# Patient Record
Sex: Female | Born: 1940
Health system: Southern US, Community
[De-identification: ages and names within clinical notes are randomized; demographics above are authoritative.]

## PROBLEM LIST (undated history)

## (undated) ENCOUNTER — Emergency Department (HOSPITAL_BASED_OUTPATIENT_CLINIC_OR_DEPARTMENT_OTHER): Admission: EM | Payer: Medicare HMO | Source: Home / Self Care

## (undated) DIAGNOSIS — F329 Major depressive disorder, single episode, unspecified: Secondary | ICD-10-CM

## (undated) DIAGNOSIS — I4891 Unspecified atrial fibrillation: Secondary | ICD-10-CM

## (undated) DIAGNOSIS — G2581 Restless legs syndrome: Secondary | ICD-10-CM

## (undated) DIAGNOSIS — F32A Depression, unspecified: Secondary | ICD-10-CM

## (undated) DIAGNOSIS — I10 Essential (primary) hypertension: Secondary | ICD-10-CM

## (undated) DIAGNOSIS — I639 Cerebral infarction, unspecified: Secondary | ICD-10-CM

## (undated) DIAGNOSIS — E785 Hyperlipidemia, unspecified: Secondary | ICD-10-CM

## (undated) HISTORY — PX: SKIN BIOPSY: SHX1

## (undated) HISTORY — PX: BACK SURGERY: SHX140

## (undated) HISTORY — PX: COLON SURGERY: SHX602

## (undated) HISTORY — PX: EYE SURGERY: SHX253

## (undated) HISTORY — PX: CHOLECYSTECTOMY: SHX55

## (undated) HISTORY — PX: BREAST SURGERY: SHX581

## (undated) HISTORY — PX: ABDOMINAL HYSTERECTOMY: SHX81

## (undated) HISTORY — PX: TONSILLECTOMY: SUR1361

## (undated) HISTORY — PX: CARPAL TUNNEL WITH CUBITAL TUNNEL: SHX5608

## (undated) HISTORY — DX: Restless legs syndrome: G25.81

## (undated) HISTORY — DX: Cerebral infarction, unspecified: I63.9

## (undated) HISTORY — PX: JOINT REPLACEMENT: SHX530

## (undated) HISTORY — DX: Unspecified atrial fibrillation: I48.91

---
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
## 2019-02-21 ENCOUNTER — Encounter (HOSPITAL_BASED_OUTPATIENT_CLINIC_OR_DEPARTMENT_OTHER): Payer: Self-pay | Admitting: *Deleted

## 2019-02-21 ENCOUNTER — Other Ambulatory Visit: Payer: Self-pay

## 2019-02-21 ENCOUNTER — Emergency Department (HOSPITAL_BASED_OUTPATIENT_CLINIC_OR_DEPARTMENT_OTHER)
Admission: EM | Admit: 2019-02-21 | Discharge: 2019-02-22 | Disposition: A | Discharge status: Home / Self Care | Payer: Medicare HMO | Attending: Emergency Medicine | Admitting: Emergency Medicine

## 2019-02-21 DIAGNOSIS — Z79899 Other long term (current) drug therapy: Secondary | ICD-10-CM | POA: Insufficient documentation

## 2019-02-21 DIAGNOSIS — I10 Essential (primary) hypertension: Secondary | ICD-10-CM | POA: Insufficient documentation

## 2019-02-21 DIAGNOSIS — R040 Epistaxis: Secondary | ICD-10-CM

## 2019-02-21 HISTORY — DX: Hyperlipidemia, unspecified: E78.5

## 2019-02-21 HISTORY — DX: Depression, unspecified: F32.A

## 2019-02-21 HISTORY — DX: Essential (primary) hypertension: I10

## 2019-02-21 MED ORDER — SILVER NITRATE-POT NITRATE 75-25 % EX MISC
CUTANEOUS | Status: AC
  Filled 2019-02-21: qty 10

## 2019-02-21 NOTE — ED Notes (Signed)
Nose bleeding  Stopped .

## 2019-02-21 NOTE — ED Provider Notes (Signed)
[facility] Provider Note: Stephanie Spurling, MD, FACEP  CSN: [id number] MRN: [id number] ARRIVAL: 02/21/19 at 2041 ROOM: MH10/[facility]10   CHIEF COMPLAINT  Epistaxis   HISTORY OF PRESENT ILLNESS  02/21/19 10:57 PM Stephanie Rodgers is a 78 y.o. female who has had sneezing and nasal congestion for most of the day.  About about 6:30 PM she developed a nosebleed that began on the right-hand side.  The flow was heavy and she was unable to get it stopped.  EMS applied pressure ice and Afrin prior to arrival and then her nose was clamped for about an hour prior to my evaluation.  On my evaluation the patient's nosebleeds has stopped.  She denies any associated pain.  At its worst she had blood coming from both nares as well as going down her throat.   Past Medical History:  Diagnosis Date  . Depression   . Hyperlipidemia   . Hypertension     History reviewed. No pertinent surgical history.  No family history on file.  Social History   Tobacco Use  . Smoking status: Not on file  Substance Use Topics  . Alcohol use: Not on file  . Drug use: Not on file    Prior to Admission medications   Medication Sig Start Date End Date Taking? Authorizing Provider  clonazePAM (KLONOPIN) 0.5 MG tablet TAKE ONE TABLET BY MOUTH EVERY NIGHT AT BEDTIME AS NEEDED FOR SLEEP 02/20/19  Yes [provider]  LORazepam (ATIVAN) 0.5 MG tablet Take 0.5 mg by mouth every 8 (eight) hours.   Yes [provider]  metoprolol tartrate (LOPRESSOR) 25 MG tablet Take 25 mg by mouth 2 (two) times daily.   Yes [provider]  rosuvastatin (CRESTOR) 20 MG tablet Take 20 mg by mouth daily.   Yes [provider]  sertraline (ZOLOFT) 50 MG tablet Take 50 mg by mouth daily.   Yes [provider]  amitriptyline (ELAVIL) 25 MG tablet Take by mouth.    [provider]    Allergies Alendronate, Ciprofloxacin, Hydrochlorothiazide, Penicillins, and Colesevelam   REVIEW  OF SYSTEMS  Negative except as noted here or in the History of Present Illness.   PHYSICAL EXAMINATION  Initial Vital Signs Blood pressure (!) 180/76, pulse (!) 53, temperature 98.9 F (37.2 C), temperature source Oral, resp. rate 18, height 5\' 4"  (1.626 m), weight 86.2 kg, SpO2 98 %.  Examination General: Well-developed, well-nourished female in no acute distress; appearance consistent with age of record HENT: normocephalic; atraumatic; clotted blood right medial nasal septum Eyes: pupils equal, round and reactive to light; extraocular muscles intact; bilateral pseudophakia Neck: supple Heart: regular rate and rhythm Lungs: clear to auscultation bilaterally Abdomen: soft; nondistended; nontender; bowel sounds present Extremities: No deformity; pulses normal Neurologic: Awake, alert and oriented; motor function intact in all extremities and symmetric; no facial droop Skin: Warm and dry Psychiatric: Normal mood and affect   RESULTS  Summary of this visit's results, reviewed and interpreted by myself:   EKG Interpretation  Date/Time:    Ventricular Rate:    PR Interval:    QRS Duration:   QT Interval:    QTC Calculation:   R Axis:     Text Interpretation:        Laboratory Studies: No results found for this or any previous visit (from the past 24 hour(s)). Imaging Studies: No results found.  ED COURSE and MDM  Nursing notes, initial and subsequent vitals signs, including pulse oximetry, reviewed and  interpreted by myself.  Vitals:   02/21/19 2043 02/21/19 2049 02/21/19 2053 02/21/19 2228  BP:   (!) 208/92 (!) 180/76  Pulse:   65 (!) 53  Resp:   18   Temp:   98.9 F (37.2 C)   TempSrc:   Oral   SpO2: 94%  99% 98%  Weight:  86.2 kg    Height:  5\' 4"  (1.626 m)     Medications  silver nitrate applicators 75-25 % applicator (has no administration in time range)    12:21 AM Bleeding site remains hemostatic.  PROCEDURES  Procedures  Bleeding site on right  nasal septum cauterized with silver nitrate.  Patient tolerated this well and there were no immediate complications.  ED DIAGNOSES     ICD-10-CM   1. Acute anterior epistaxis  R04.0        Adisa Vigeant, , MD 02/22/19 [phone]

## 2019-02-21 NOTE — ED Notes (Signed)
Afrin given with nose clamp placed .

## 2019-02-21 NOTE — ED Triage Notes (Signed)
Pressure , ice and Afrin 2 spray PTA by EMS for nosebleed x 45 mins

## 2019-02-22 MED ORDER — OXYMETAZOLINE HCL 0.05 % NA SOLN
NASAL | Status: AC
  Filled 2019-02-22: qty 30

## 2019-02-22 NOTE — ED Notes (Signed)
Family at bedside, no bleeding noted from nose

## 2019-06-24 LAB — COLOGUARD: COLOGUARD: NEGATIVE

## 2019-06-24 LAB — EXTERNAL GENERIC LAB PROCEDURE: COLOGUARD: NEGATIVE

## 2019-12-08 ENCOUNTER — Other Ambulatory Visit: Payer: Self-pay

## 2019-12-08 ENCOUNTER — Emergency Department (HOSPITAL_COMMUNITY): Payer: Medicare HMO

## 2019-12-08 ENCOUNTER — Inpatient Hospital Stay (HOSPITAL_COMMUNITY)
Admission: EM | Admit: 2019-12-08 | Discharge: 2019-12-11 | DRG: 065 | Disposition: A | Discharge status: Home Health | Payer: Medicare HMO | Attending: Neurology | Admitting: Neurology

## 2019-12-08 ENCOUNTER — Inpatient Hospital Stay (HOSPITAL_COMMUNITY): Payer: Medicare HMO

## 2019-12-08 ENCOUNTER — Encounter (HOSPITAL_COMMUNITY): Payer: Self-pay | Admitting: Radiology

## 2019-12-08 DIAGNOSIS — Z6830 Body mass index (BMI) 30.0-30.9, adult: Secondary | ICD-10-CM

## 2019-12-08 DIAGNOSIS — Z20822 Contact with and (suspected) exposure to covid-19: Secondary | ICD-10-CM | POA: Diagnosis present

## 2019-12-08 DIAGNOSIS — E785 Hyperlipidemia, unspecified: Secondary | ICD-10-CM | POA: Diagnosis present

## 2019-12-08 DIAGNOSIS — I63412 Cerebral infarction due to embolism of left middle cerebral artery: Secondary | ICD-10-CM | POA: Diagnosis present

## 2019-12-08 DIAGNOSIS — E669 Obesity, unspecified: Secondary | ICD-10-CM | POA: Diagnosis present

## 2019-12-08 DIAGNOSIS — I63512 Cerebral infarction due to unspecified occlusion or stenosis of left middle cerebral artery: Secondary | ICD-10-CM

## 2019-12-08 DIAGNOSIS — Z881 Allergy status to other antibiotic agents status: Secondary | ICD-10-CM | POA: Diagnosis not present

## 2019-12-08 DIAGNOSIS — I639 Cerebral infarction, unspecified: Secondary | ICD-10-CM

## 2019-12-08 DIAGNOSIS — I350 Nonrheumatic aortic (valve) stenosis: Secondary | ICD-10-CM | POA: Diagnosis not present

## 2019-12-08 DIAGNOSIS — F419 Anxiety disorder, unspecified: Secondary | ICD-10-CM | POA: Diagnosis present

## 2019-12-08 DIAGNOSIS — Z888 Allergy status to other drugs, medicaments and biological substances status: Secondary | ICD-10-CM | POA: Diagnosis not present

## 2019-12-08 DIAGNOSIS — Z880 Allergy status to penicillin: Secondary | ICD-10-CM | POA: Diagnosis not present

## 2019-12-08 DIAGNOSIS — Z79899 Other long term (current) drug therapy: Secondary | ICD-10-CM

## 2019-12-08 DIAGNOSIS — I358 Other nonrheumatic aortic valve disorders: Secondary | ICD-10-CM | POA: Diagnosis present

## 2019-12-08 DIAGNOSIS — I6340 Cerebral infarction due to embolism of unspecified cerebral artery: Secondary | ICD-10-CM | POA: Diagnosis present

## 2019-12-08 DIAGNOSIS — Z713 Dietary counseling and surveillance: Secondary | ICD-10-CM

## 2019-12-08 DIAGNOSIS — R29703 NIHSS score 3: Secondary | ICD-10-CM | POA: Diagnosis present

## 2019-12-08 DIAGNOSIS — R4701 Aphasia: Secondary | ICD-10-CM | POA: Diagnosis present

## 2019-12-08 DIAGNOSIS — R414 Neurologic neglect syndrome: Secondary | ICD-10-CM | POA: Diagnosis present

## 2019-12-08 DIAGNOSIS — F329 Major depressive disorder, single episode, unspecified: Secondary | ICD-10-CM | POA: Diagnosis present

## 2019-12-08 DIAGNOSIS — I10 Essential (primary) hypertension: Secondary | ICD-10-CM | POA: Diagnosis present

## 2019-12-08 DIAGNOSIS — I4891 Unspecified atrial fibrillation: Secondary | ICD-10-CM

## 2019-12-08 DIAGNOSIS — I340 Nonrheumatic mitral (valve) insufficiency: Secondary | ICD-10-CM | POA: Diagnosis not present

## 2019-12-08 LAB — I-STAT CHEM 8, ED
BUN: 27 mg/dL — ABNORMAL HIGH (ref 8–23)
Calcium, Ion: 0.96 mmol/L — ABNORMAL LOW (ref 1.15–1.40)
Chloride: 112 mmol/L — ABNORMAL HIGH (ref 98–111)
Creatinine, Ser: 0.6 mg/dL (ref 0.44–1.00)
Glucose, Bld: 111 mg/dL — ABNORMAL HIGH (ref 70–99)
HCT: 41 % (ref 36.0–46.0)
Hemoglobin: 13.9 g/dL (ref 12.0–15.0)
Potassium: 4.6 mmol/L (ref 3.5–5.1)
Sodium: 140 mmol/L (ref 135–145)
TCO2: 22 mmol/L (ref 22–32)

## 2019-12-08 LAB — MRSA PCR SCREENING: MRSA by PCR: NEGATIVE

## 2019-12-08 LAB — COMPREHENSIVE METABOLIC PANEL
ALT: 17 U/L (ref 0–44)
AST: 24 U/L (ref 15–41)
Albumin: 3.8 g/dL (ref 3.5–5.0)
Alkaline Phosphatase: 77 U/L (ref 38–126)
Anion gap: 14 (ref 5–15)
BUN: 19 mg/dL (ref 8–23)
CO2: 16 mmol/L — ABNORMAL LOW (ref 22–32)
Calcium: 8.9 mg/dL (ref 8.9–10.3)
Chloride: 109 mmol/L (ref 98–111)
Creatinine, Ser: 0.72 mg/dL (ref 0.44–1.00)
GFR calc Af Amer: >60 mL/min (ref >60)
GFR calc non Af Amer: >60 mL/min (ref >60)
Glucose, Bld: 114 mg/dL — ABNORMAL HIGH (ref 70–99)
Potassium: 4.4 mmol/L (ref 3.5–5.1)
Sodium: 139 mmol/L (ref 135–145)
Total Bilirubin: 0.5 mg/dL (ref 0.3–1.2)
Total Protein: 7 g/dL (ref 6.5–8.1)

## 2019-12-08 LAB — ECHOCARDIOGRAM COMPLETE
AR max vel: 1.49 cm2
AV Area VTI: 1.64 cm2
AV Area mean vel: 1.35 cm2
AV Mean grad: 10.1 mmHg
AV Peak grad: 18 mmHg
Ao pk vel: 2.12 m/s
Area-P 1/2: 3.03 cm2
Height: 64 in
MV M vel: 4.93 m/s
MV Peak grad: 97.2 mmHg
S' Lateral: 2 cm
Weight: 2880 oz

## 2019-12-08 LAB — APTT: aPTT: 29 seconds (ref 24–36)

## 2019-12-08 LAB — PROTIME-INR
INR: 1.1 (ref 0.8–1.2)
Prothrombin Time: 13.3 seconds (ref 11.4–15.2)

## 2019-12-08 LAB — CBC
HCT: 40.8 % (ref 36.0–46.0)
Hemoglobin: 12.8 g/dL (ref 12.0–15.0)
MCH: 29.7 pg (ref 26.0–34.0)
MCHC: 31.4 g/dL (ref 30.0–36.0)
MCV: 94.7 fL (ref 80.0–100.0)
Platelets: 223 10*3/uL (ref 150–400)
RBC: 4.31 MIL/uL (ref 3.87–5.11)
RDW: 15.6 % — ABNORMAL HIGH (ref 11.5–15.5)
WBC: 8.8 10*3/uL (ref 4.0–10.5)
nRBC: 0 % (ref 0.0–0.2)

## 2019-12-08 LAB — DIFFERENTIAL
Abs Immature Granulocytes: 0.03 10*3/uL (ref 0.00–0.07)
Basophils Absolute: 0.1 10*3/uL (ref 0.0–0.1)
Basophils Relative: 1 %
Eosinophils Absolute: 0.1 10*3/uL (ref 0.0–0.5)
Eosinophils Relative: 1 %
Immature Granulocytes: 0 %
Lymphocytes Relative: 18 %
Lymphs Abs: 1.6 10*3/uL (ref 0.7–4.0)
Monocytes Absolute: 0.5 10*3/uL (ref 0.1–1.0)
Monocytes Relative: 6 %
Neutro Abs: 6.4 10*3/uL (ref 1.7–7.7)
Neutrophils Relative %: 74 %

## 2019-12-08 LAB — LACTIC ACID, PLASMA: Lactic Acid, Venous: 1.2 mmol/L (ref 0.5–1.9)

## 2019-12-08 LAB — SARS CORONAVIRUS 2 BY RT PCR (HOSPITAL ORDER, PERFORMED IN ~~LOC~~ HOSPITAL LAB): SARS Coronavirus 2: NEGATIVE

## 2019-12-08 LAB — ETHANOL: Alcohol, Ethyl (B): <10 mg/dL (ref <10)

## 2019-12-08 IMAGING — MR MR MRA NECK W/O CM
1 series · 48 of 48 positions shown · non-contrast
Comparison: None.

CLINICAL DATA: Aphasia

EXAM:
MRA NECK WITHOUT CONTRAST
TECHNIQUE: Angiographic images of the neck were obtained using MRA technique
without intravenous contrast. Carotid stenosis measurements (when
applicable) are obtained utilizing NASCET criteria, using the distal
internal carotid diameter as the denominator.

[Series 7: [id number]_tra (better) · axial · 0.8mm · 0.78mm/px · z∈[-280,-86]mm · 48 of 246 slices shown]
[im 1/246]
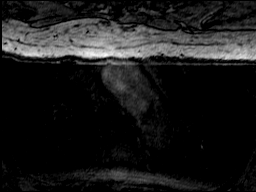
[im 6/246]
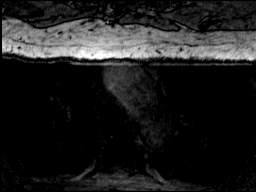
[im 11/246]
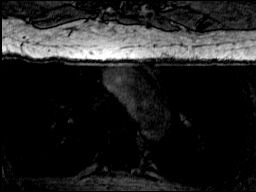
[im 16/246]
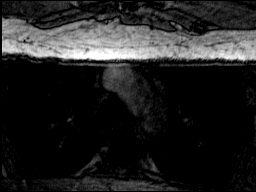
[im 21/246]
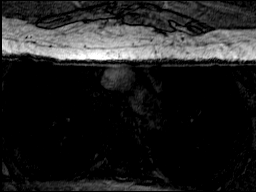
[im 27/246]
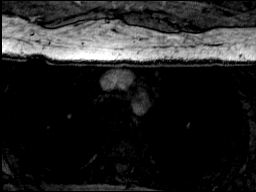
[im 32/246]
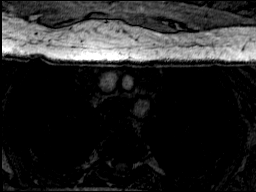
[im 37/246]
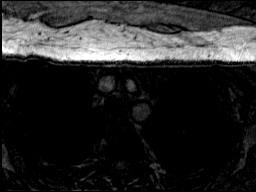
[im 42/246]
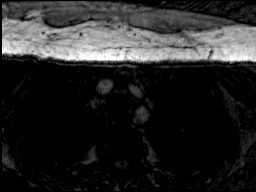
[im 47/246]
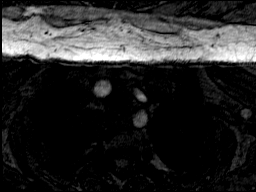
[im 53/246]
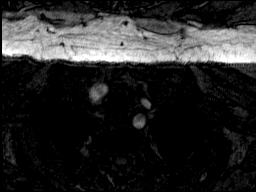
[im 58/246]
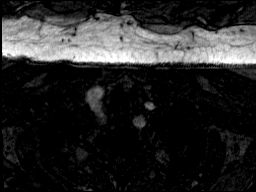
[im 63/246]
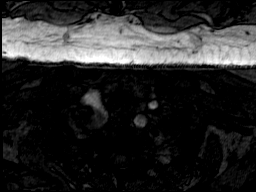
[im 68/246]
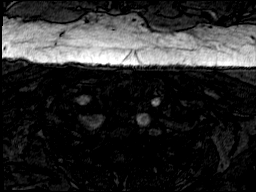
[im 73/246]
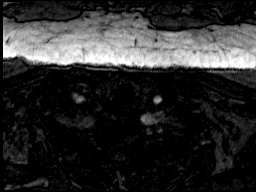
[im 79/246]
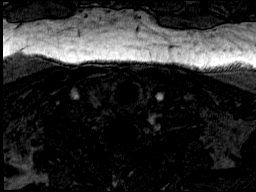
[im 84/246]
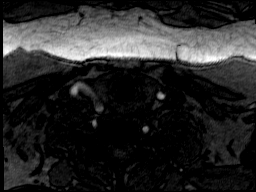
[im 89/246]
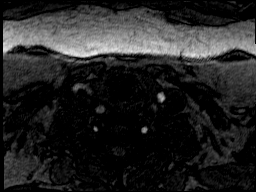
[im 94/246]
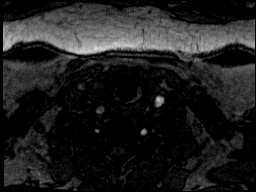
[im 100/246]
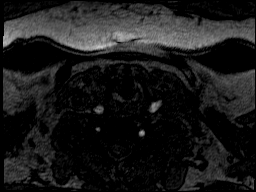
[im 105/246]
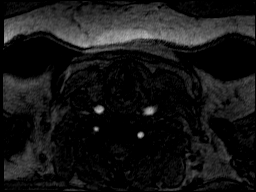
[im 110/246]
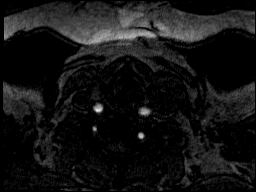
[im 115/246]
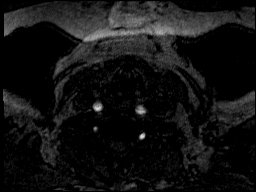
[im 120/246]
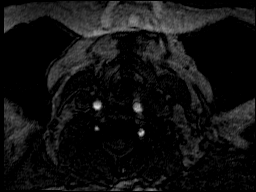
[im 126/246]
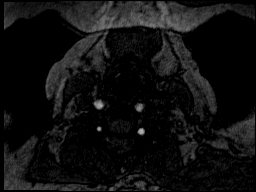
[im 131/246]
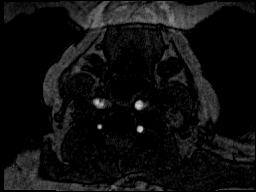
[im 136/246]
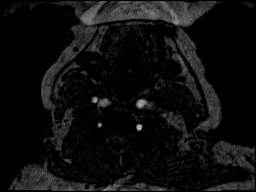
[im 141/246]
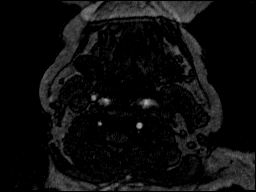
[im 146/246]
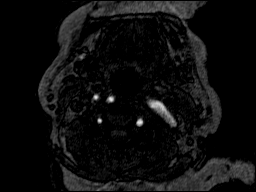
[im 152/246]
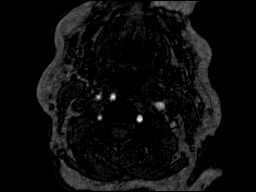
[im 157/246]
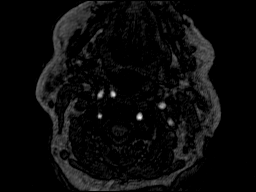
[im 162/246]
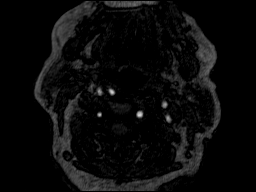
[im 167/246]
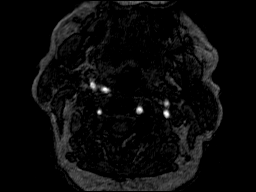
[im 173/246]
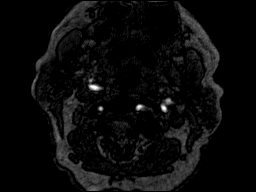
[im 178/246]
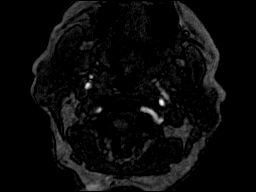
[im 183/246]
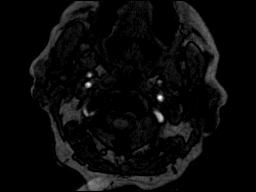
[im 188/246]
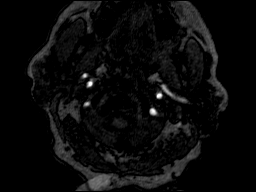
[im 193/246]
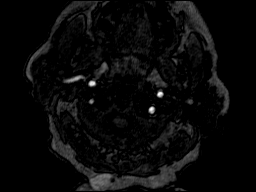
[im 199/246]
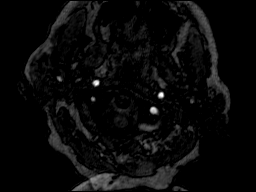
[im 204/246]
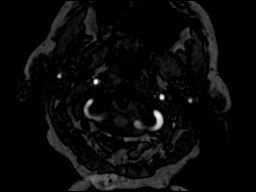
[im 209/246]
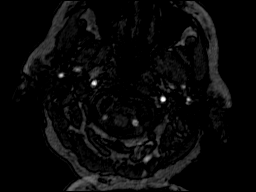
[im 214/246]
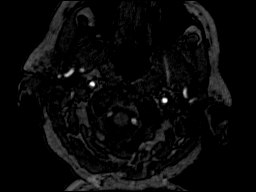
[im 219/246]
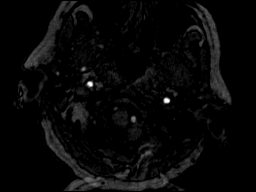
[im 225/246]
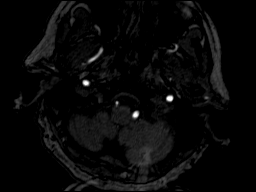
[im 230/246]
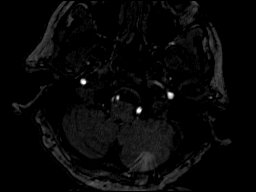
[im 235/246]
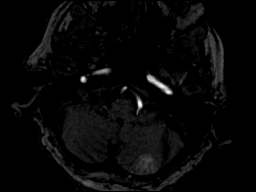
[im 240/246]
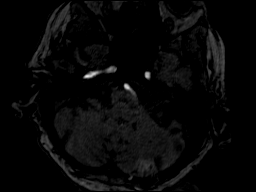
[im 246/246]
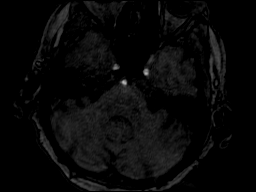

[48 of 48 positions shown; findings below may reference images not displayed]

FINDINGS: Left dominant vertebral arteries. Both vertebral arteries are normal
to the vertebrobasilar confluence. The origins are poorly
visualized.

No carotid stenosis.

Normal 3 vessel aortic branching pattern.
IMPRESSION: Normal MRA of the neck.

## 2019-12-08 IMAGING — CT CT HEAD W/O CM
4 series · 17 of 47 positions shown, 19 images · non-contrast
Comparison: None.

CLINICAL DATA: Expressive aphasia

EXAM:
CT HEAD WITHOUT CONTRAST
TECHNIQUE: Contiguous axial images were obtained from the base of the skull
through the vertex without intravenous contrast.

[Series 3: head without · axial · non-contrast · 0.45mm/px · z∈[+45,+170]mm · 7 of 35 slices shown, 9 images]
[im 5/35  brain]
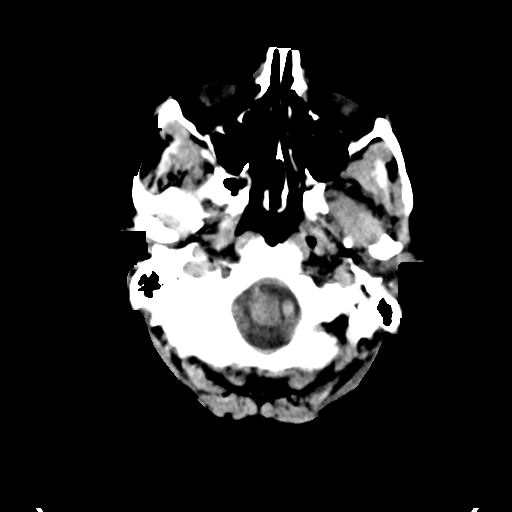
[im 5/35  bone]
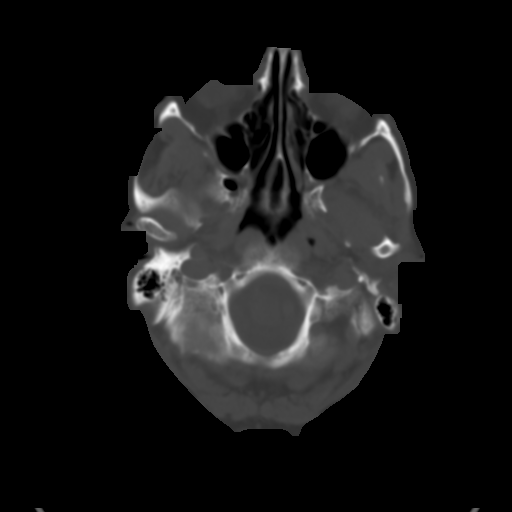
[im 9/35  brain]
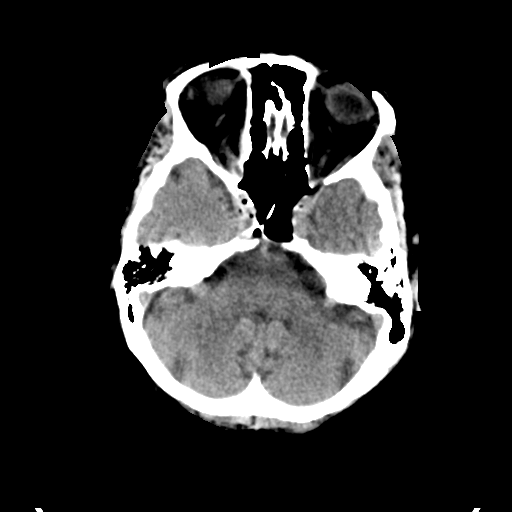
[im 13/35  brain]
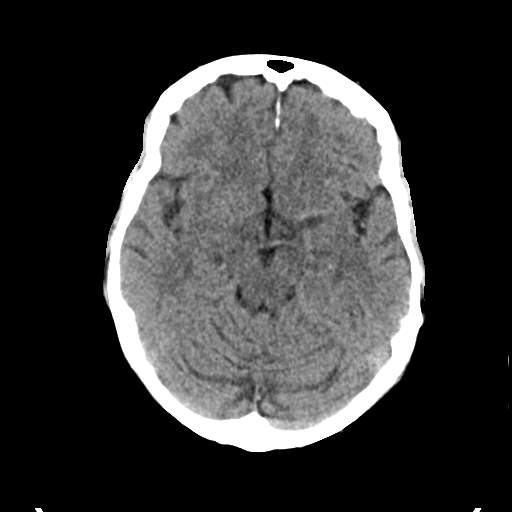
[im 18/35  brain]
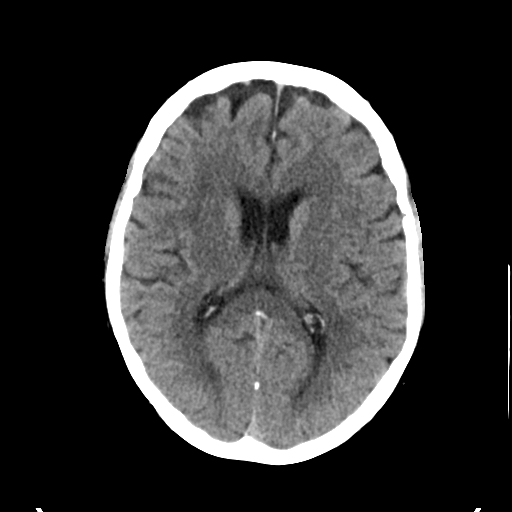
[im 22/35  brain]
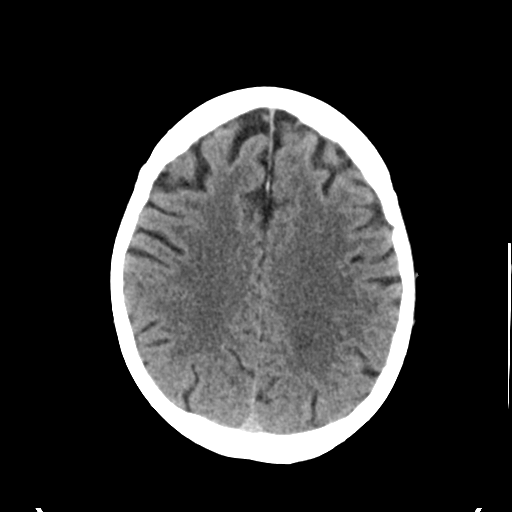
[im 22/35  bone]
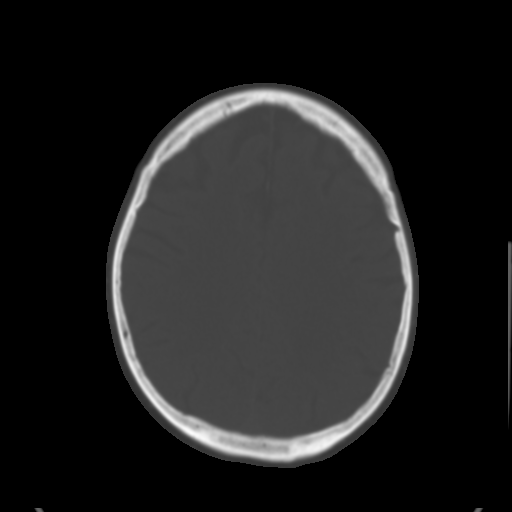
[im 26/35  brain]
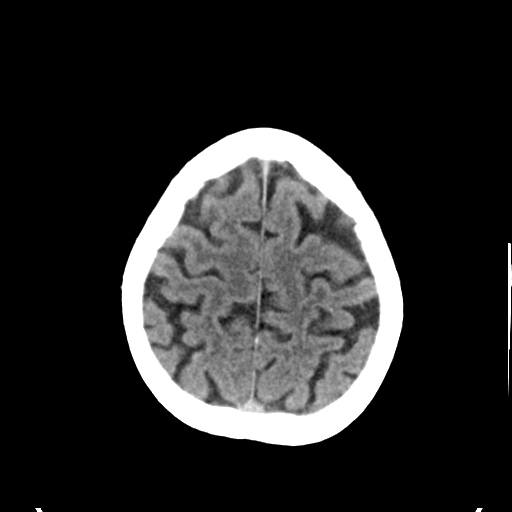
[im 30/35  brain]
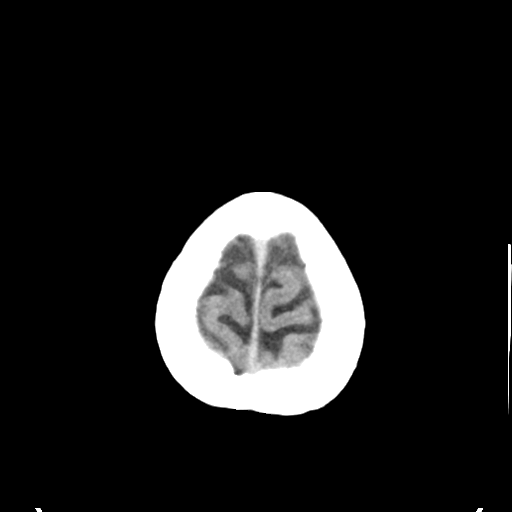

[Series 4: head bone · axial · 0.45mm/px · z∈[+41,+101]mm · 4 of 86 slices shown]
[im 9/86  bone]
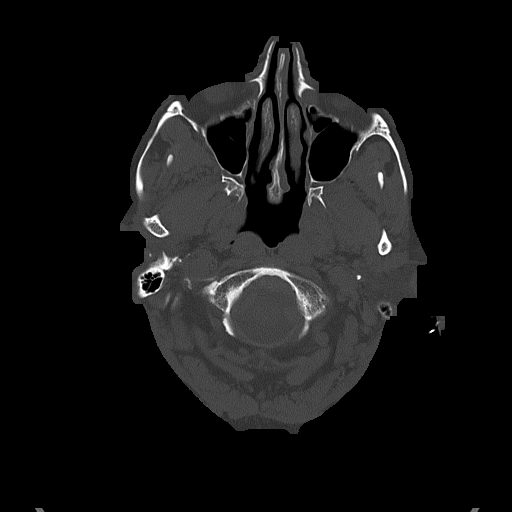
[im 18/86  bone]
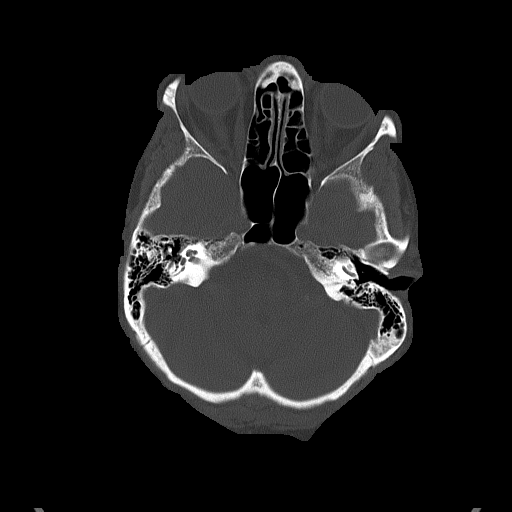
[im 26/86  bone]
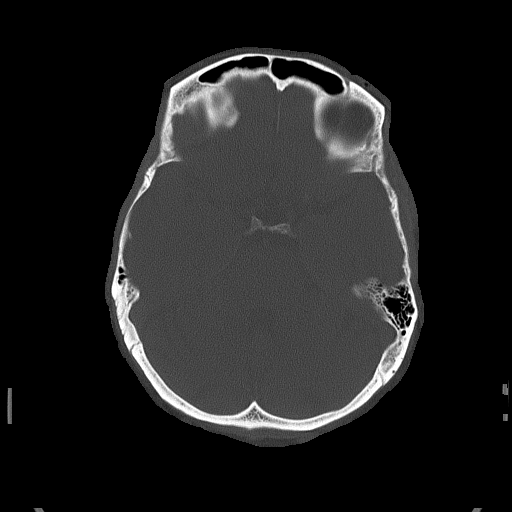
[im 39/86  bone]
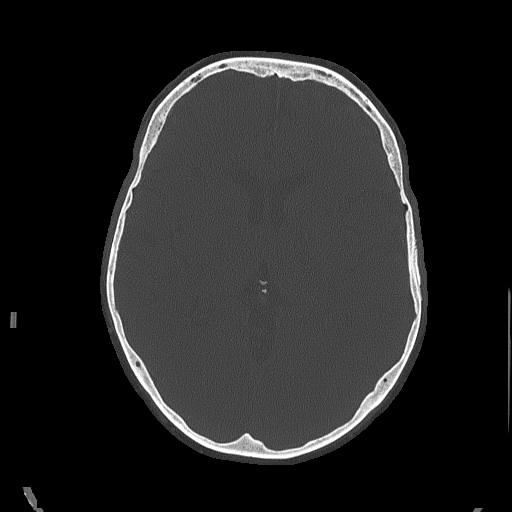

[Series 5: head without cor · coronal · non-contrast · 0.33mm/px · 3 of 69 slices shown]
[im 23/69  brain]
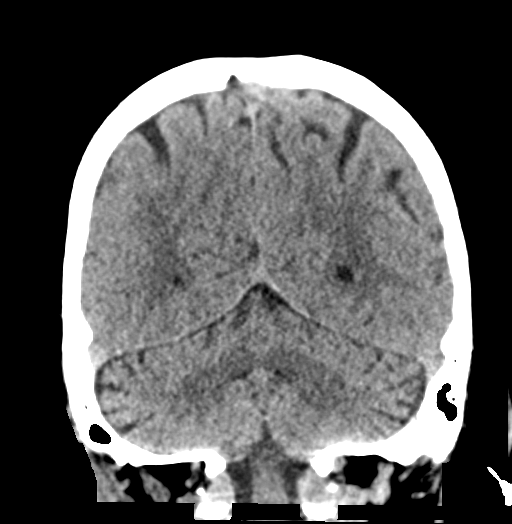
[im 31/69  brain]
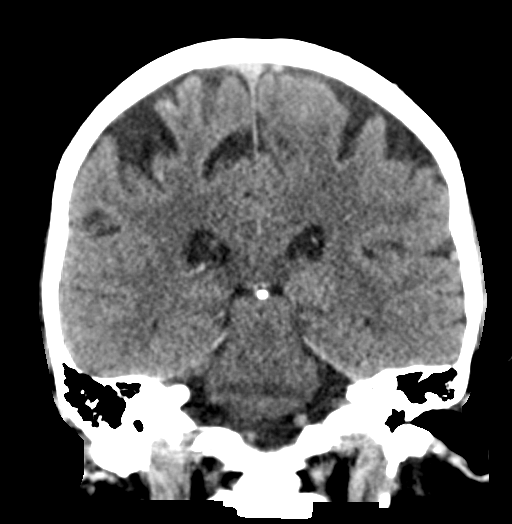
[im 38/69  brain]
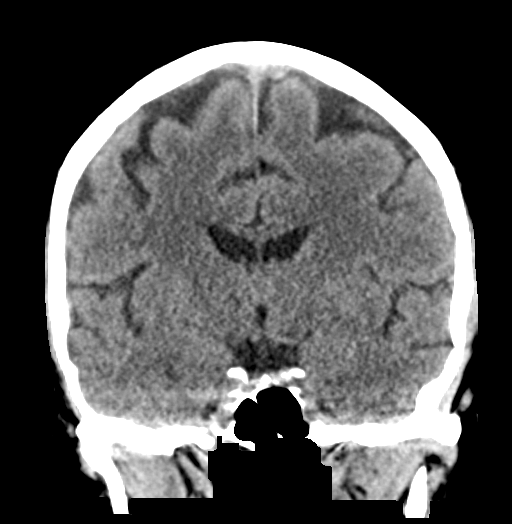

[Series 6: head without sag · sagittal · non-contrast · 0.34mm/px · 3 of 61 slices shown]
[im 21/61  brain]
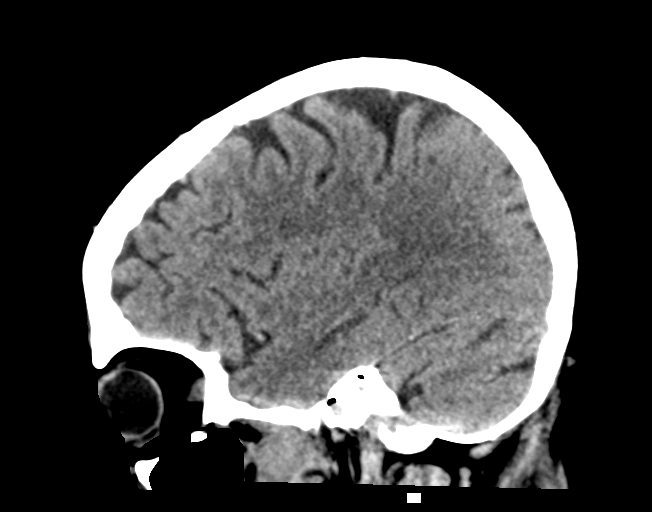
[im 31/61  brain]
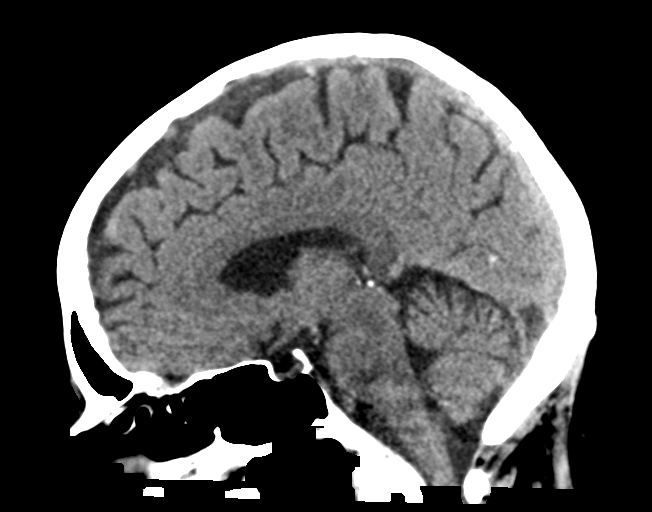
[im 41/61  brain]
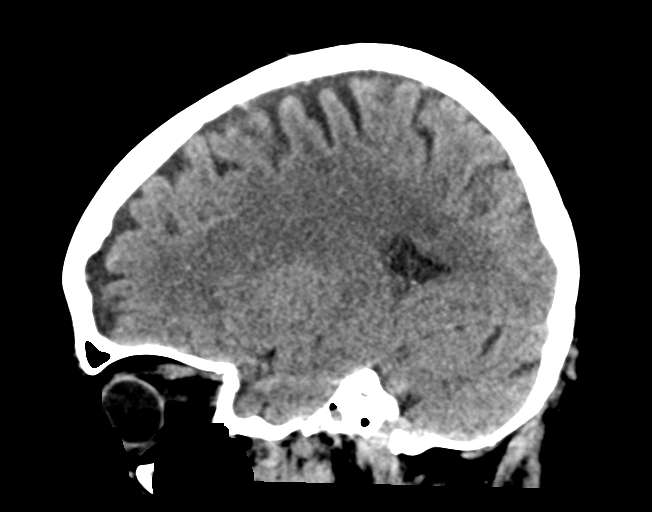

[17 of 47 positions shown; findings below may reference images not displayed]

FINDINGS: Brain: There is no mass, hemorrhage or extra-axial collection. The
size and configuration of the ventricles and extra-axial CSF spaces
are normal. The brain parenchyma is normal, without acute or chronic
infarction.

Vascular: No abnormal hyperdensity of the major intracranial
arteries or dural venous sinuses. No intracranial atherosclerosis.

Skull: The visualized skull base, calvarium and extracranial soft
tissues are normal.

Sinuses/Orbits: No fluid levels or advanced mucosal thickening of
the visualized paranasal sinuses. No mastoid or middle ear effusion.
The orbits are normal.
IMPRESSION: Normal head CT.

## 2019-12-08 IMAGING — CR DG CHEST 2V
2 series · 3 of 3 positions shown · non-contrast
Comparison: [DATE] [DATE], [DATE], [DATE] [DATE], [DATE]

CLINICAL DATA: Shortness of breath

EXAM:
CHEST - 2 VIEW

[Series 2: chest lat · 0.14mm/px · 2 of 2 slices shown]
[im 1/2]
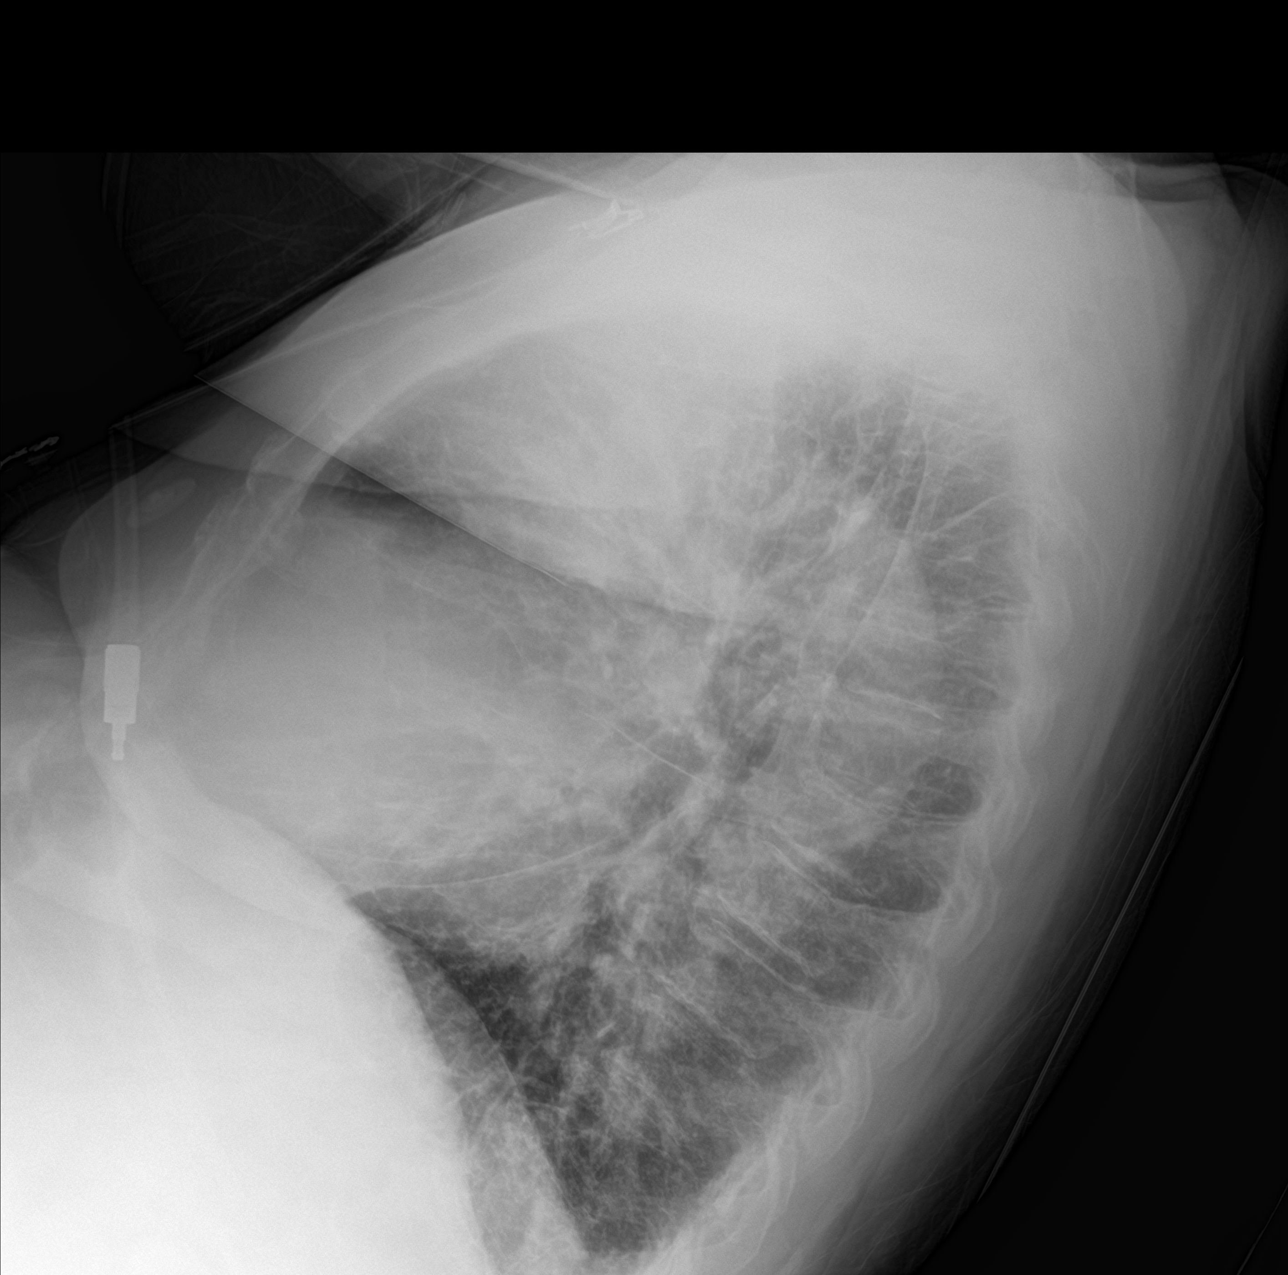
[im 2/2]
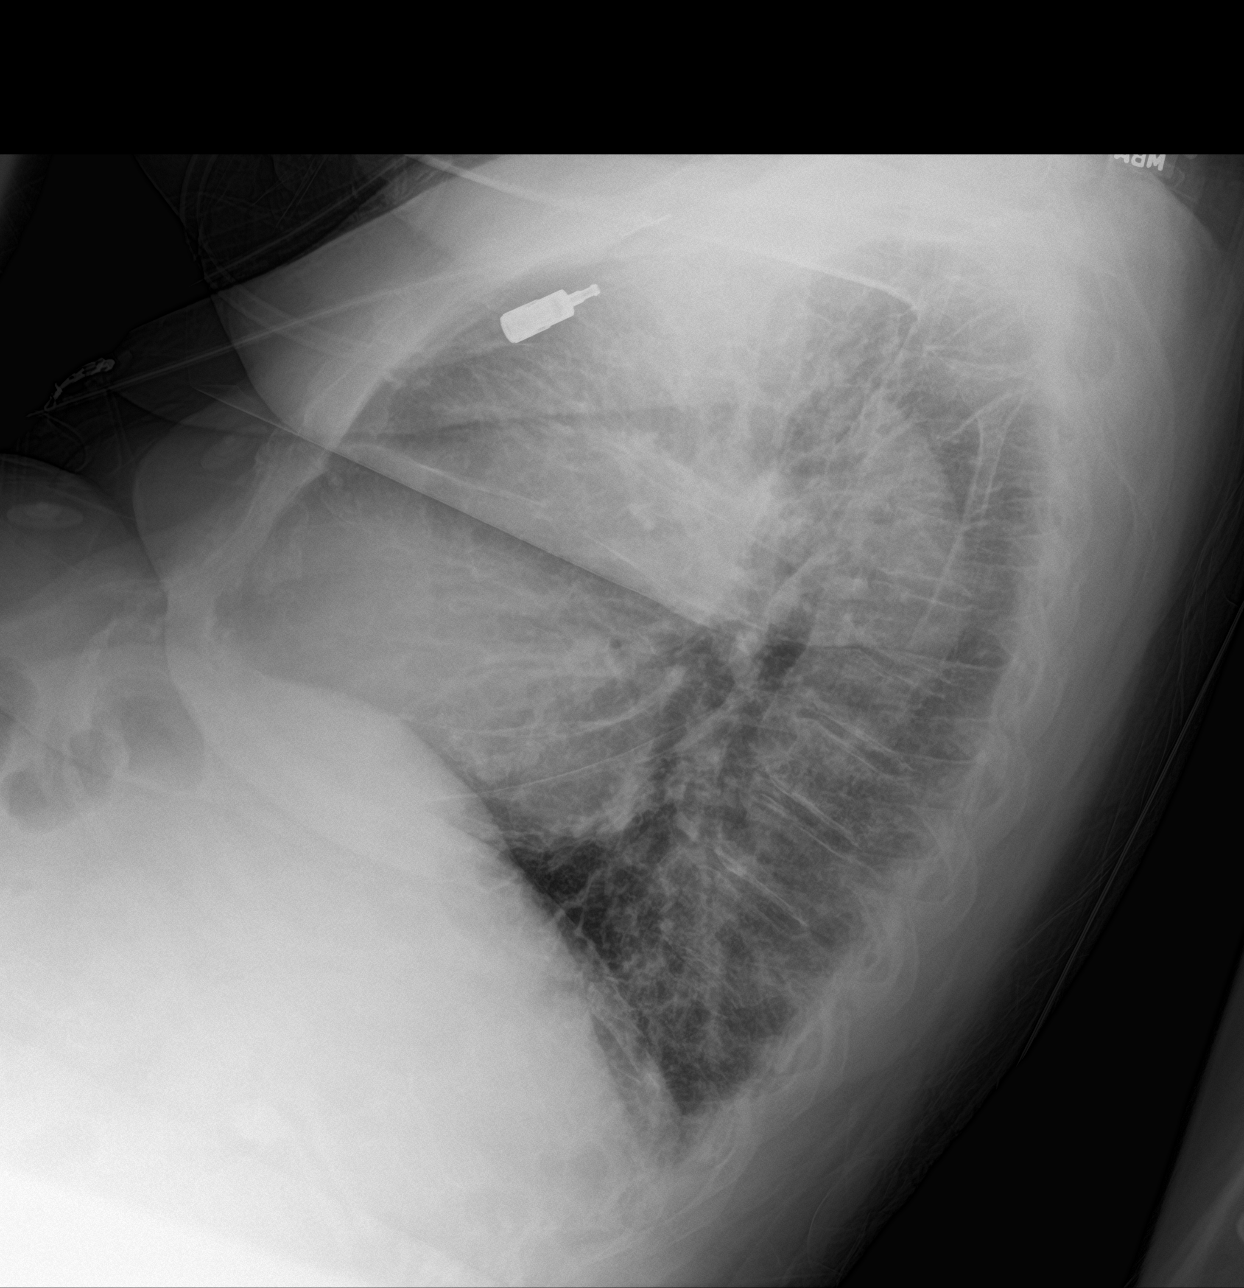

[chest ap]
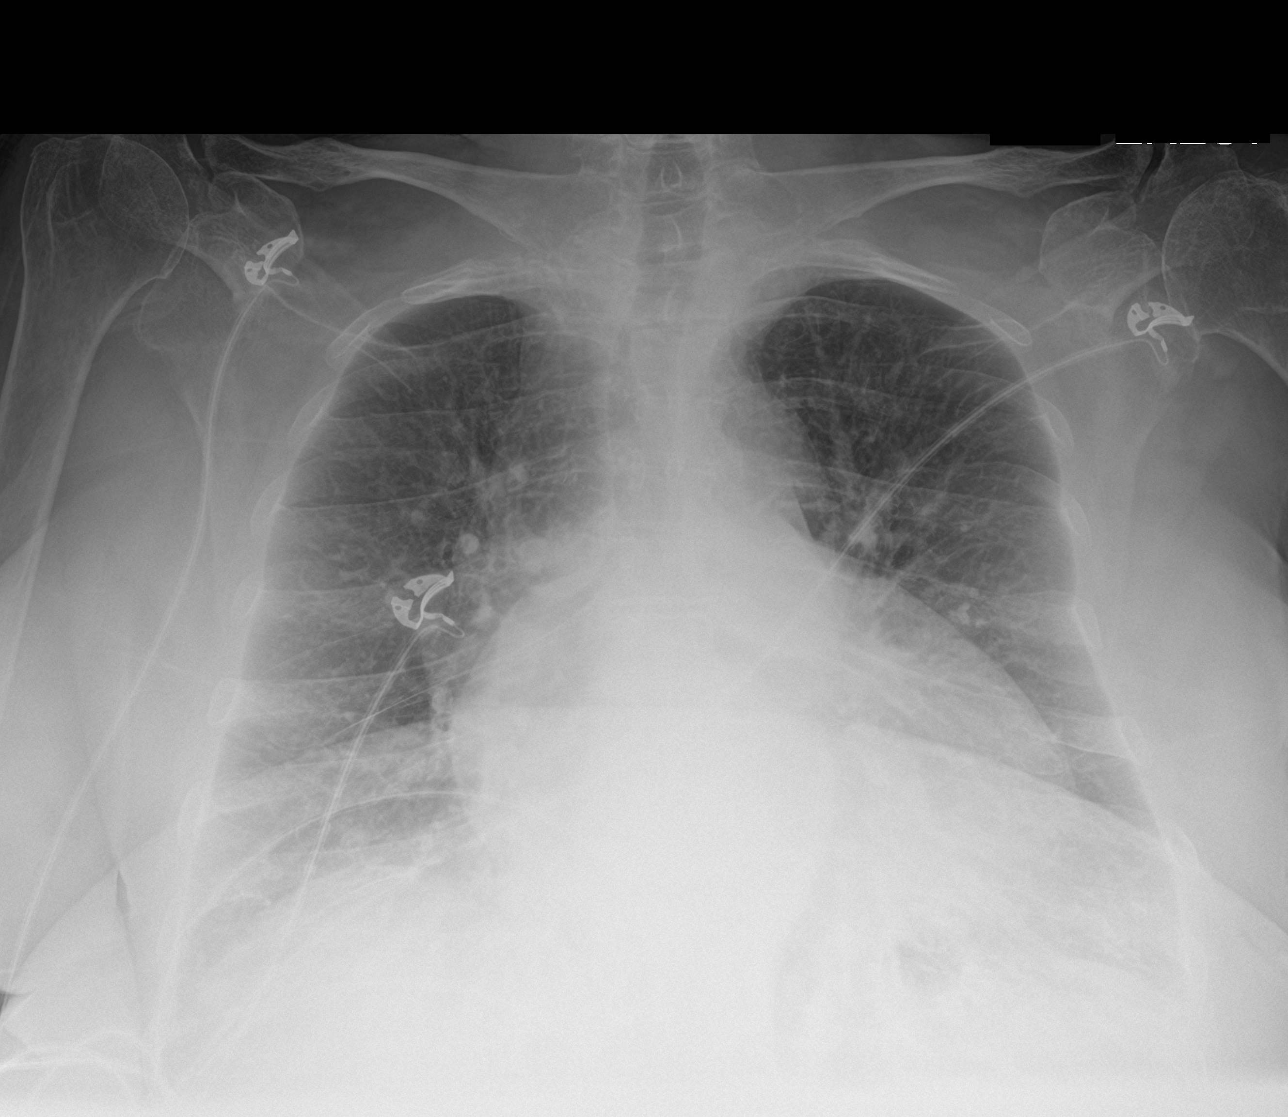

[3 of 3 positions shown; findings below may reference images not displayed]

FINDINGS: The cardiomediastinal silhouette has increased in size since [39].
It had a more rounded globular appearance.Diffuse interstitial
prominence. No pleural effusion. No pneumothorax. No focal
consolidation. Visualized abdomen is unremarkable. Multilevel
degenerative changes of the thoracic spine.
IMPRESSION: 1. Increased size of the cardiomediastinal silhouette since [39].
Pericardial effusion is on the differential. Recommend correlation
with echocardiography.
2. Diffuse interstitial prominence is favored to reflect underlying
pulmonary edema. Additional differential considerations include
atypical infection versus pulmonary fibrosis.

## 2019-12-08 IMAGING — CT CT ANGIO HEAD-NECK
2 of 7 series · 8 of 33 positions shown · IV contrast (OMNI 350)
Comparison: MRI head [DATE].  CT head [DATE]

CLINICAL DATA: Stroke.  Aphasia.

EXAM:
CT ANGIOGRAPHY HEAD
TECHNIQUE: Multidetector CT imaging of the head was performed using the
standard protocol during bolus administration of intravenous
contrast. Multiplanar CT image reconstructions and MIPs were
obtained to evaluate the vascular anatomy.
CONTRAST:  100mL OMNIPAQUE IOHEXOL 350 MG/ML SOLN

[Series 4: cta neck · axial · 0.40mm/px · z∈[-96,-46]mm · 2 of 76 slices shown]
[im 26/76  soft-tissue]
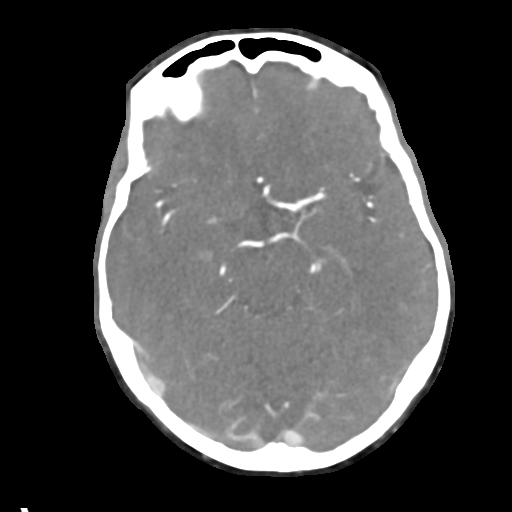
[im 51/76  soft-tissue]
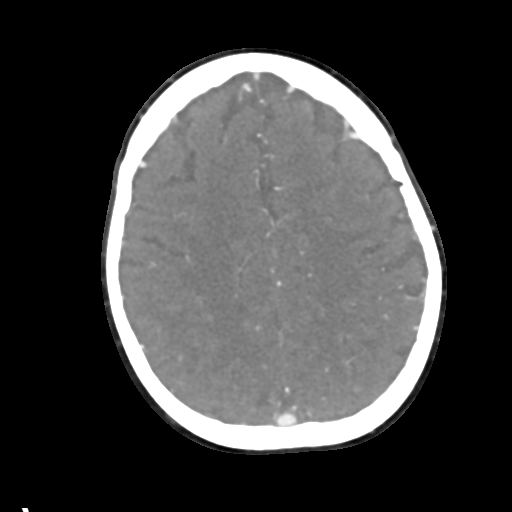

[Series 6: cta neck axial · axial · 0.39mm/px · z∈[-124,-19]mm · 6 of 149 slices shown]
[im 22/149  soft-tissue]
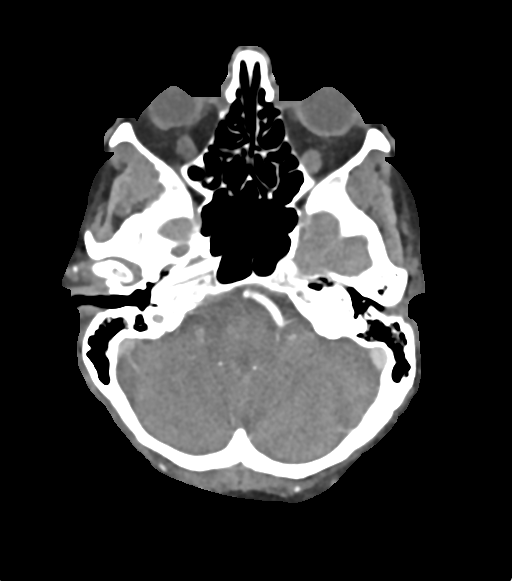
[im 43/149  bone]
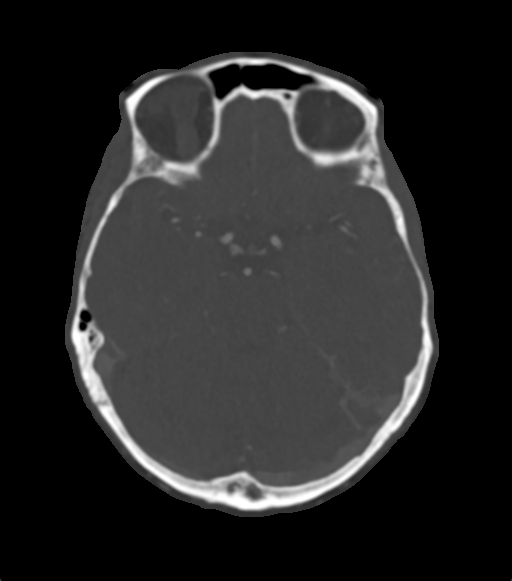
[im 64/149  soft-tissue]
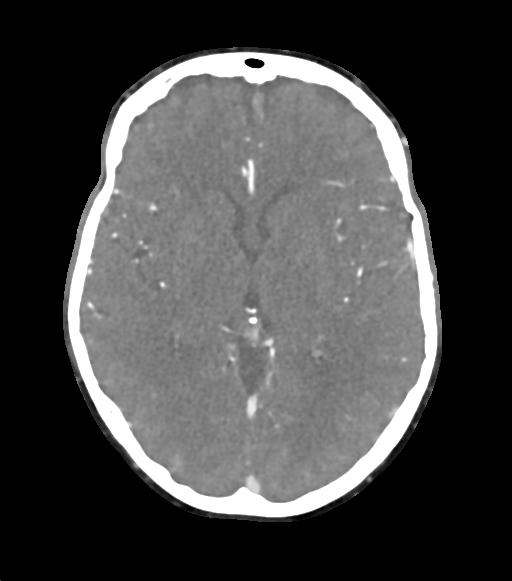
[im 85/149  bone]
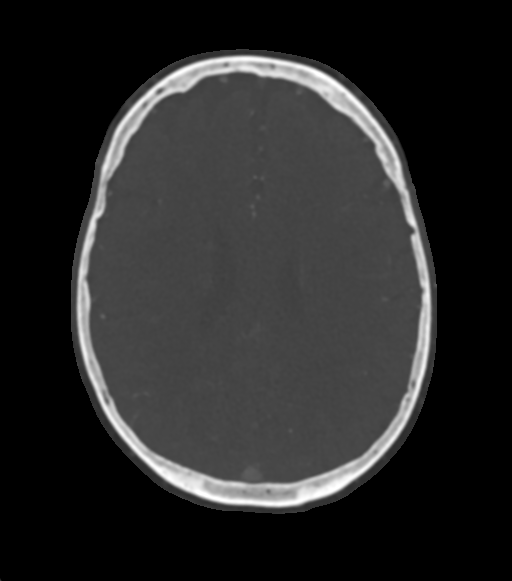
[im 106/149  soft-tissue]
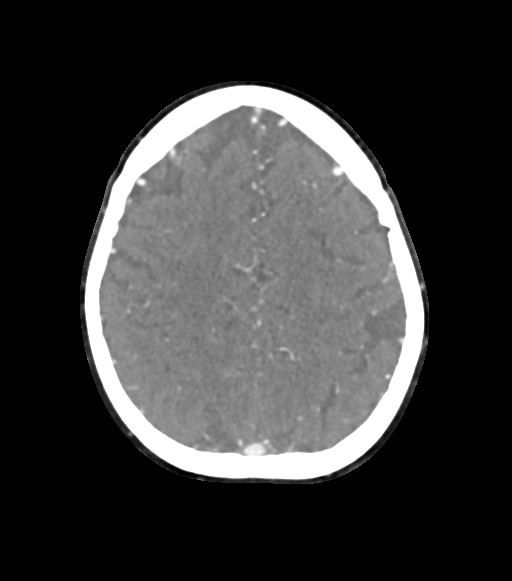
[im 127/149  bone]
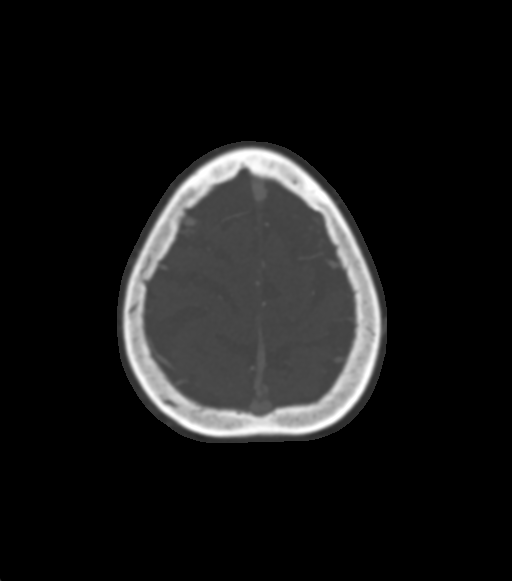

[8 of 33 positions shown; findings below may reference images not displayed]

FINDINGS: CTA HEAD

Anterior circulation: Cavernous carotid widely patent without
atherosclerotic disease or stenosis. Anterior cerebral arteries
widely patent bilaterally. Right middle cerebral artery widely
patent.

Left M1 segment widely patent. There is clot in 2 branches of the
inferior division of the left MCA which are nonocclusive with distal
flow. These are short segment filling defects due to embolus.
Superior division left MCA appears widely patent.

Posterior circulation: Both vertebral arteries patent to the
basilar. PICA patent bilaterally. Basilar widely patent. AICA,
superior cerebellar, and posterior cerebral arteries are all patent
without significant stenosis.

Venous sinuses: Normal venous enhancement

Anatomic variants: None
IMPRESSION: There is clot in two branches of the inferior division left MCA
which are nonocclusive. Probable emboli. Superior division left MCA
patent. Left M1 segment patent.

No other intracranial stenosis or large vessel occlusion

These results were called by telephone at the time of interpretation
on [DATE] at [DATE] to provider DEVARDO

, who verbally acknowledged these results.

## 2019-12-08 IMAGING — MR MR HEAD W/O CM
12 of 13 series · 44 of 48 positions shown · non-contrast
Comparison: None.

CLINICAL DATA: Aphasia

EXAM:
MRI HEAD WITHOUT CONTRAST
TECHNIQUE: Multiplanar, multiecho pulse sequences of the brain and surrounding
structures were obtained without intravenous contrast.

[Series 5: DWI · axial · 3.0mm · 0.88mm/px · z∈[-125,+20]mm · 8 of 100 slices shown (1 of 4)]
[im 1/100]
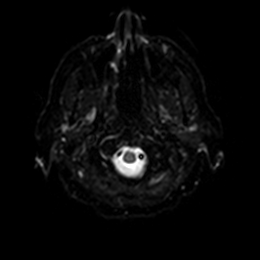
[im 15/100]
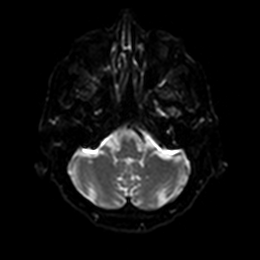
[im 29/100]
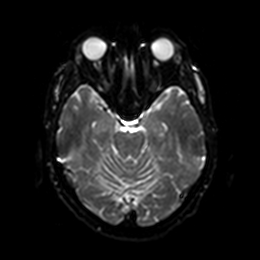
[im 43/100]
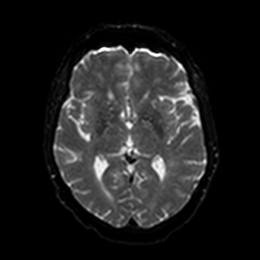
[im 57/100]
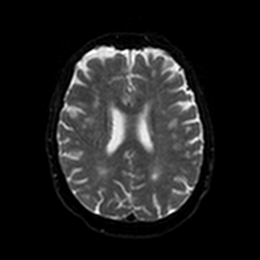
[im 71/100]
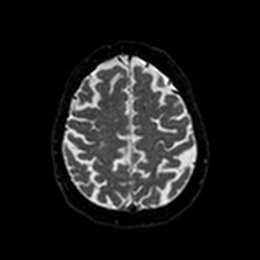
[im 85/100]
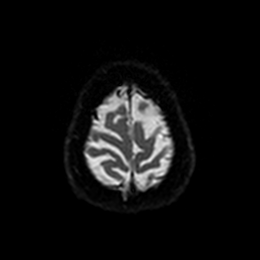
[im 100/100]
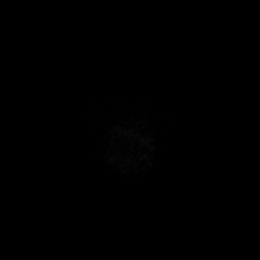

[Series 6: DWI · axial · 3.0mm · 0.88mm/px · z∈[-125,+20]mm · 4 of 50 slices shown (2 of 4)]
[im 1/50]
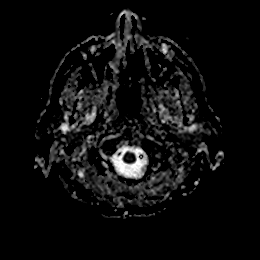
[im 17/50]
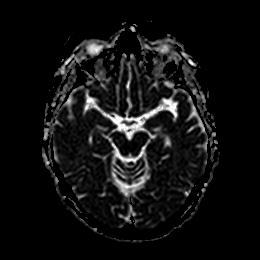
[im 33/50]
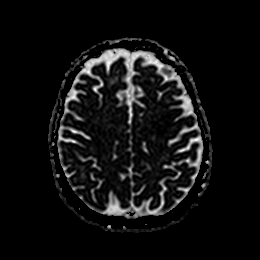
[im 50/50]
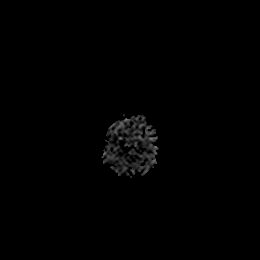

[Series 7: DWI · coronal · 4.0mm · 0.88mm/px · 5 of 72 slices shown (3 of 4)]
[im 1/72]
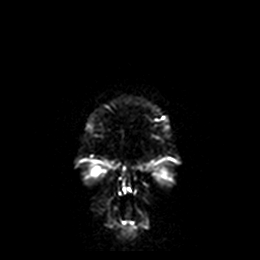
[im 18/72]
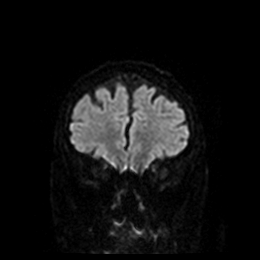
[im 36/72]
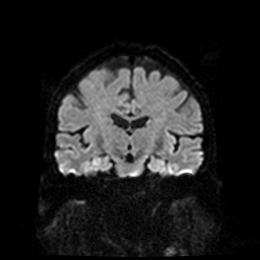
[im 54/72]
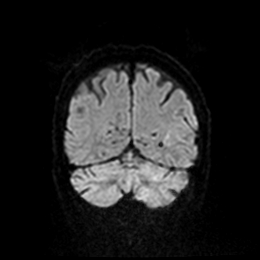
[im 72/72]
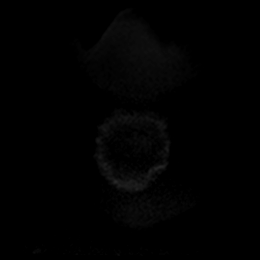

[Series 8: DWI · coronal · 4.0mm · 0.88mm/px · 3 of 36 slices shown (4 of 4)]
[im 1/36]
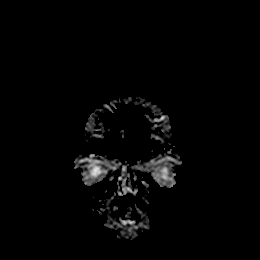
[im 18/36]
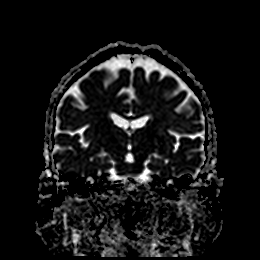
[im 36/36]
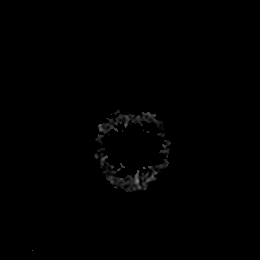

[Series 9: T1 · sagittal · 5.0mm · 0.75mm/px · 2 of 23 slices shown]
[im 1/23]
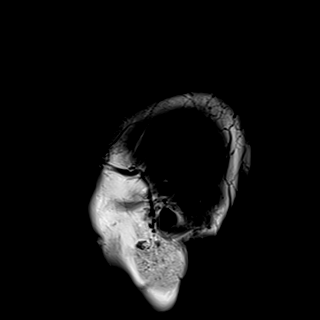
[im 23/23]
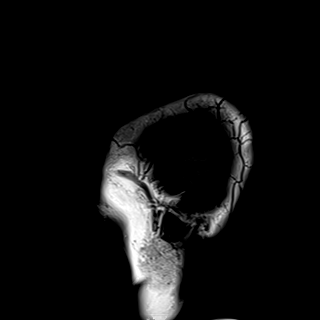

[Series 10: T2 · axial · 5.0mm · 0.72mm/px · z∈[-130,+24]mm · 2 of 27 slices shown (1 of 2)]
[im 1/27]
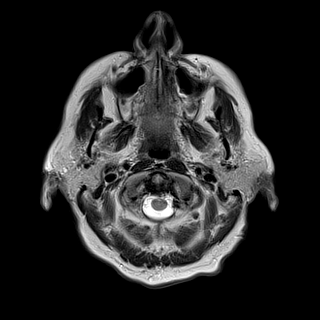
[im 27/27]
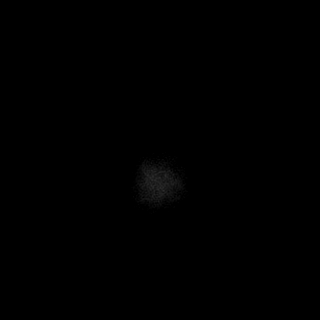

[Series 11: FLAIR · axial · 5.0mm · 0.45mm/px · z∈[-130,+24]mm · 2 of 27 slices shown]
[im 1/27]
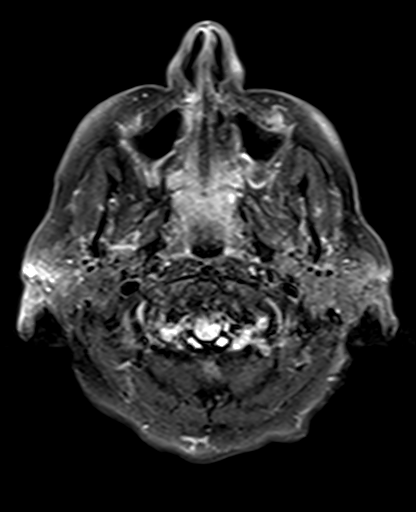
[im 27/27]
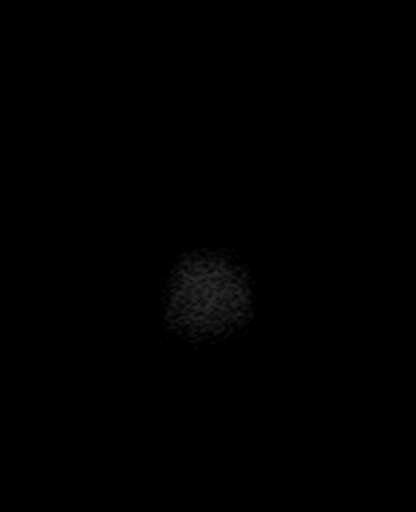

[Series 12: mag_images · axial · 3.0mm · 0.90mm/px · z∈[-139,+35]mm · 4 of 60 slices shown]
[im 1/60]
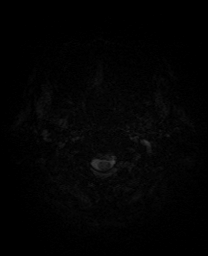
[im 20/60]
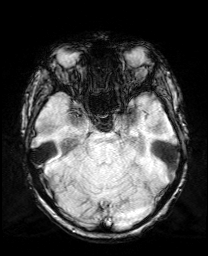
[im 40/60]
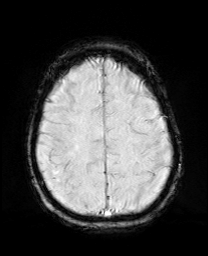
[im 60/60]
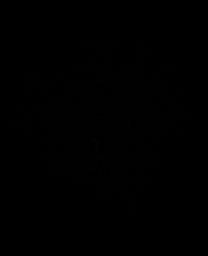

[Series 13: pha_images · axial · 3.0mm · 0.90mm/px · z∈[-139,+24]mm · 4 of 56 slices shown]
[im 1/56]
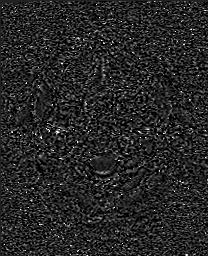
[im 19/56]
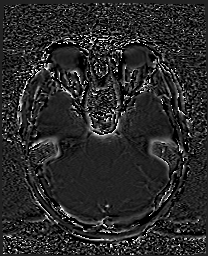
[im 37/56]
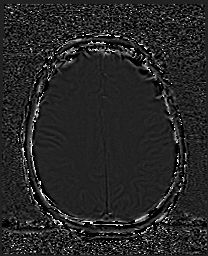
[im 56/56]
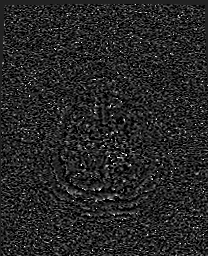

[Series 14: swi_images · axial · 3.0mm · 0.90mm/px · z∈[-139,+35]mm · 4 of 60 slices shown]
[im 1/60]
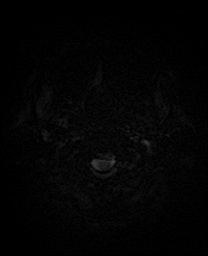
[im 20/60]
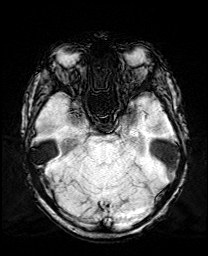
[im 40/60]
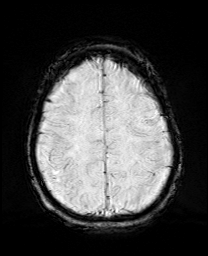
[im 60/60]
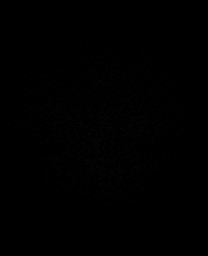

[Series 15: mip_images(sw) · axial · 24.0mm · 0.90mm/px · z∈[-129,+25]mm · 4 of 53 slices shown]
[im 1/53]
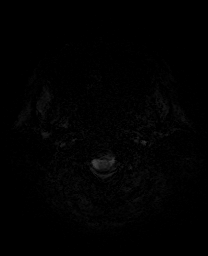
[im 18/53]
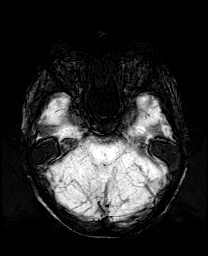
[im 35/53]
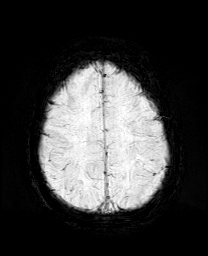
[im 53/53]
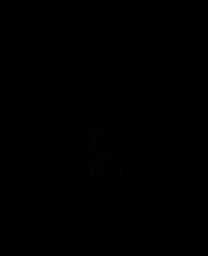

[Series 17: T2 · coronal · 5.0mm · 0.34mm/px · 2 of 29 slices shown (2 of 2)]
[im 1/29]
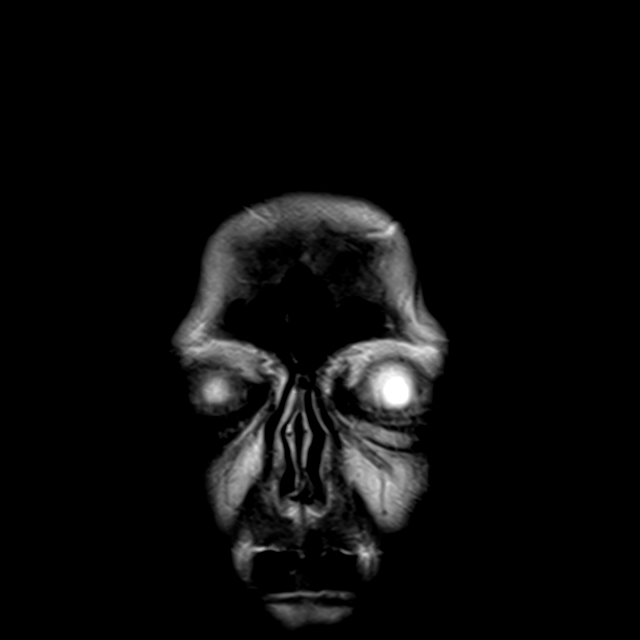
[im 29/29]
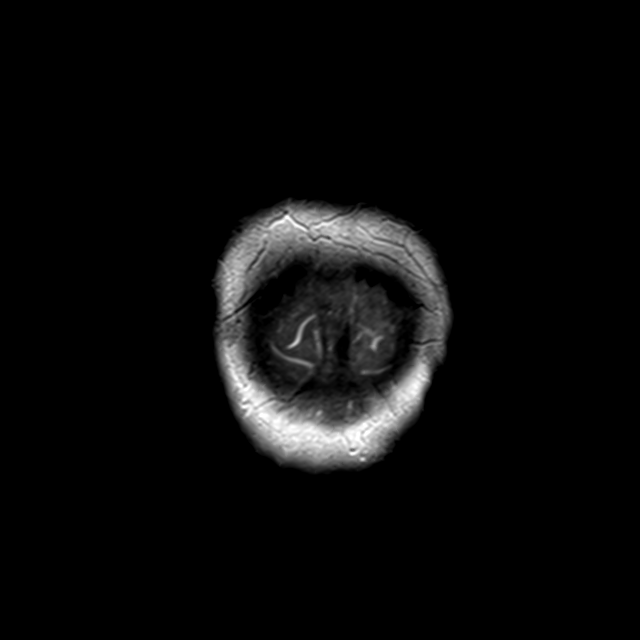

[44 of 48 positions shown; findings below may reference images not displayed]

FINDINGS: Brain: There is a punctate focus of abnormal diffusion restriction
in the left frontal operculum. There is a focus of mild
hyperintensity on diffusion-weighted imaging within the left
parietotemporal periatrial white matter. No corresponding ADC
deficit. Multifocal hyperintense T2-weighted signal within the white
matter. Normal volume of CSF spaces. No chronic microhemorrhage.
Normal midline structures.

Vascular: Normal flow voids.

Skull and upper cervical spine: Normal marrow signal.

Sinuses/Orbits: Negative.

Other: None.
IMPRESSION: 1. Punctate acute infarct within the left frontal operculum, which
could contribute to aphasia.
2. Possible small subacute infarct of the left periatrial white
matter.

## 2019-12-08 IMAGING — MR MR HEAD W/O CM
5 series · 43 of 48 positions shown · non-contrast
Comparison: MRI head [DATE]

CLINICAL DATA: Stroke follow-up

EXAM:
MRI HEAD WITHOUT CONTRAST
TECHNIQUE: Multiplanar, multiecho pulse sequences of the brain and surrounding
structures were obtained without intravenous contrast.

[Series 2: DWI · axial · 3.0mm · 0.94mm/px · z∈[-128,+22]mm · 13 of 104 slices shown (1 of 2)]
[im 1/104]
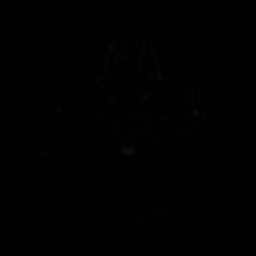
[im 9/104]
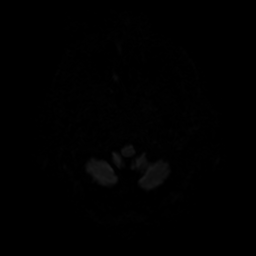
[im 18/104]
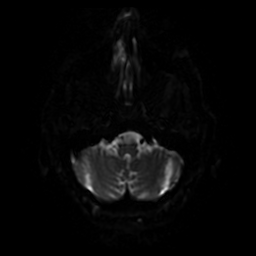
[im 26/104]
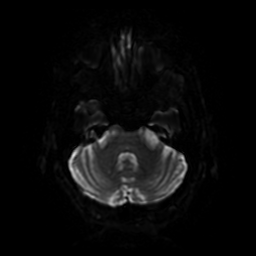
[im 35/104]
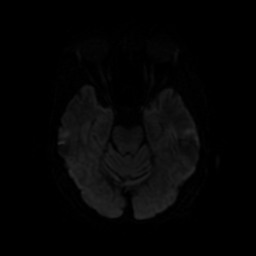
[im 43/104]
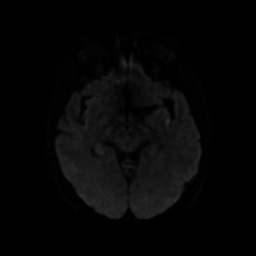
[im 52/104]
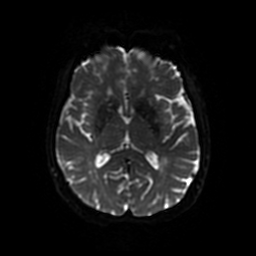
[im 61/104]
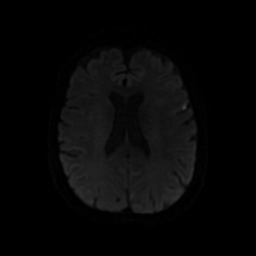
[im 69/104]
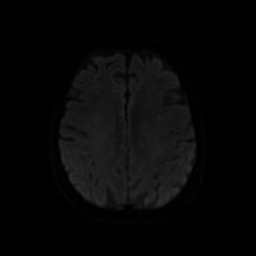
[im 78/104]
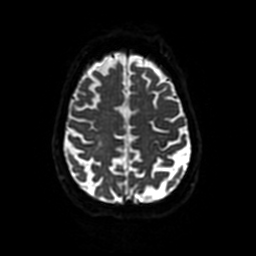
[im 86/104]
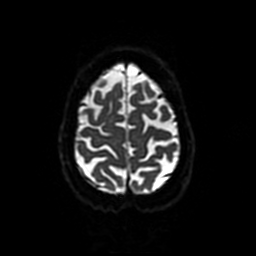
[im 95/104]
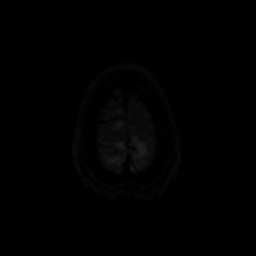
[im 104/104]
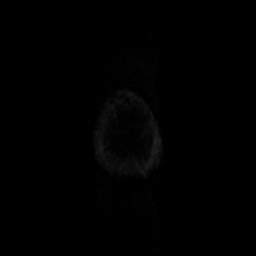

[Series 3: DWI · coronal · 4.0mm · 0.94mm/px · 9 of 74 slices shown (2 of 2)]
[im 1/74]
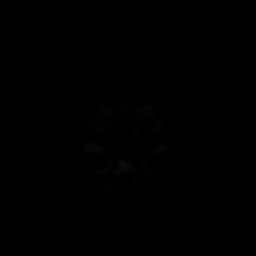
[im 10/74]
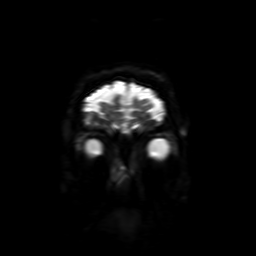
[im 19/74]
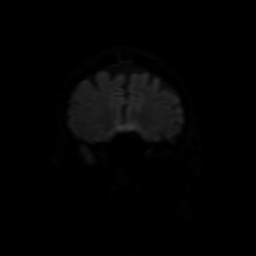
[im 28/74]
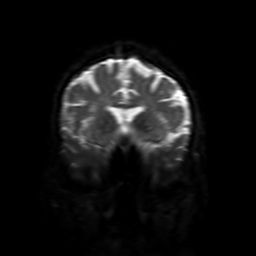
[im 37/74]
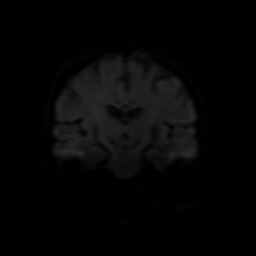
[im 46/74]
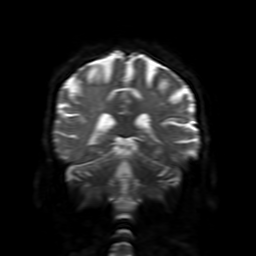
[im 55/74]
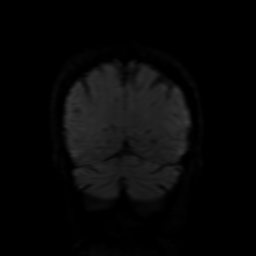
[im 64/74]
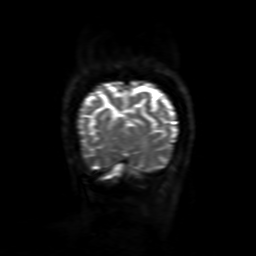
[im 74/74]
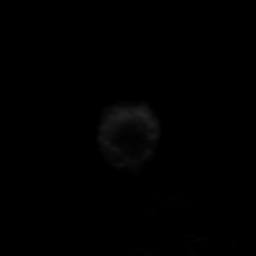

[Series 4: (person_name) · axial · 3.0mm · 0.47mm/px · z∈[-131,+26]mm · 9 of 108 slices shown]
[im 1/108]
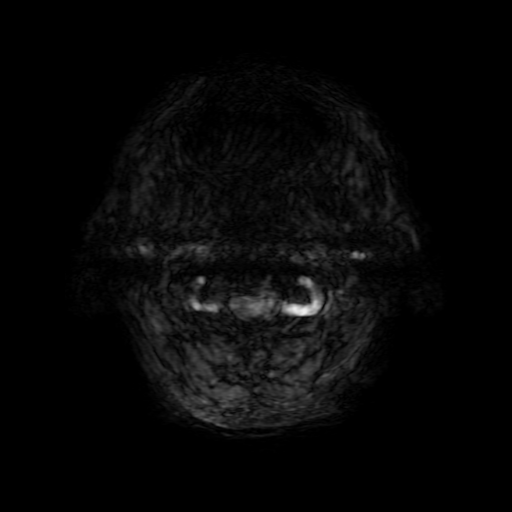
[im 9/108]
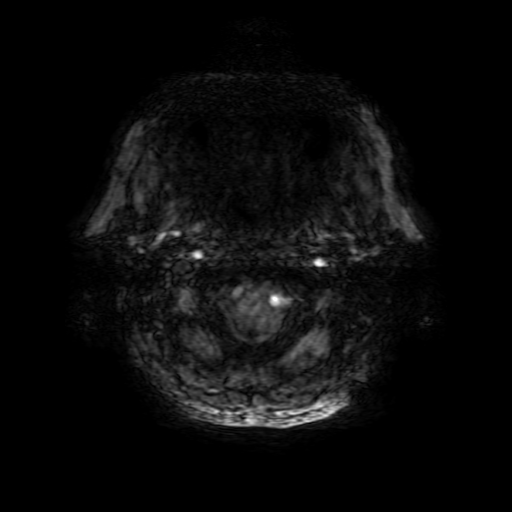
[im 17/108]
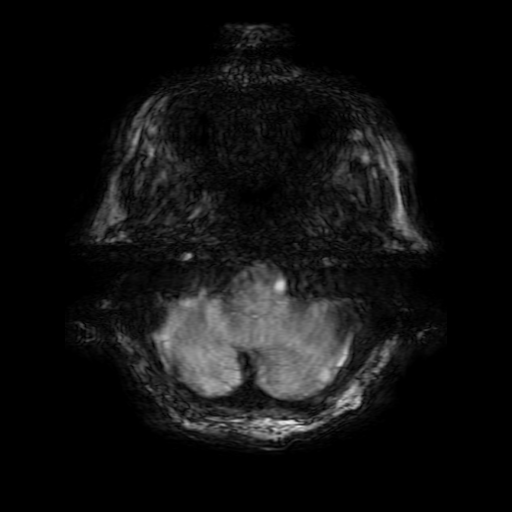
[im 33/108]
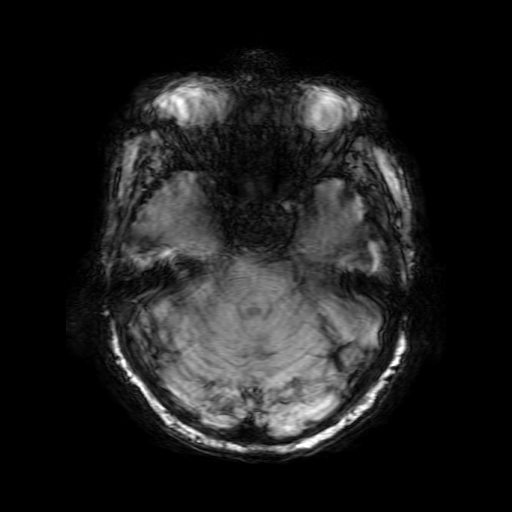
[im 50/108]
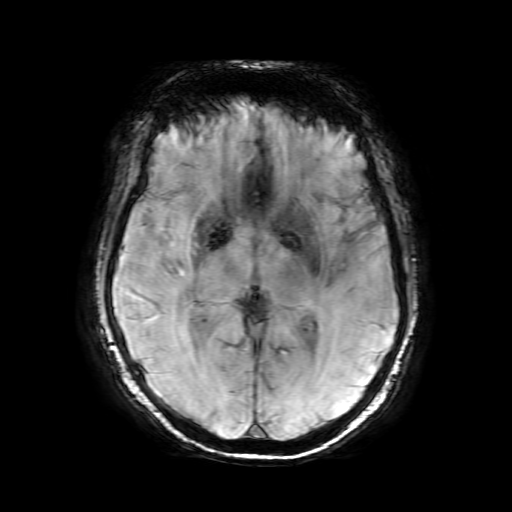
[im 58/108]
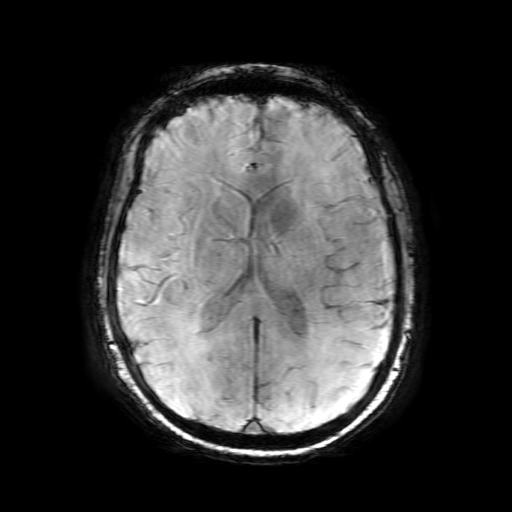
[im 75/108]
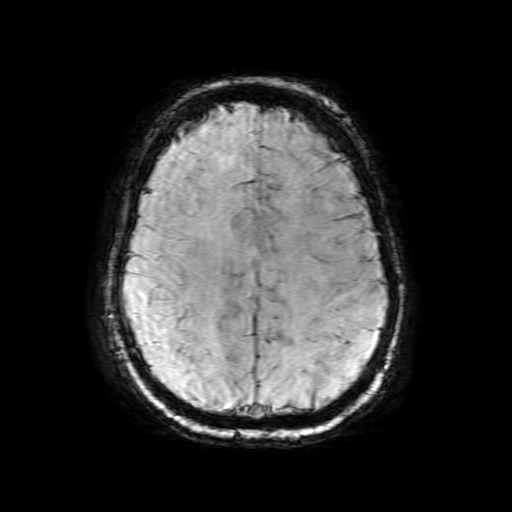
[im 91/108]
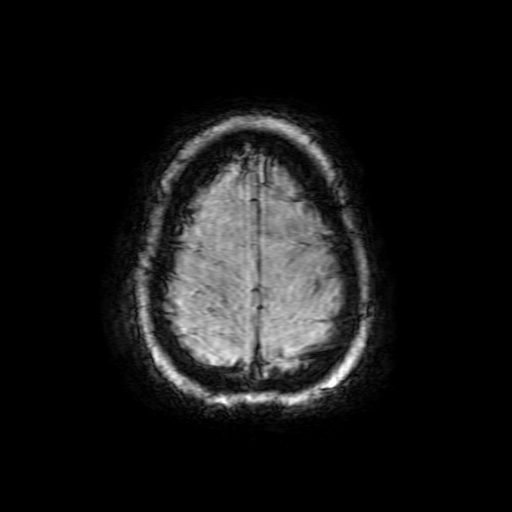
[im 108/108]
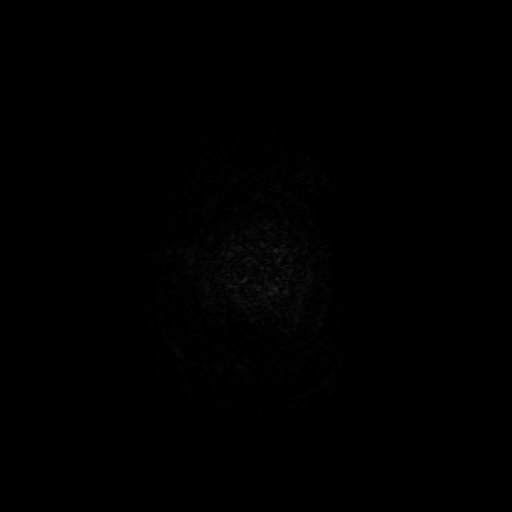

[Series 250: ADC · axial · 3.0mm · 0.94mm/px · z∈[-128,+22]mm · 7 of 52 slices shown (1 of 2)]
[im 1/52]
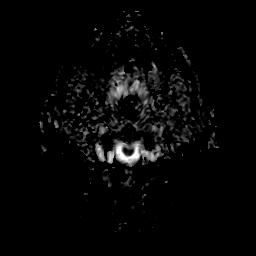
[im 9/52]
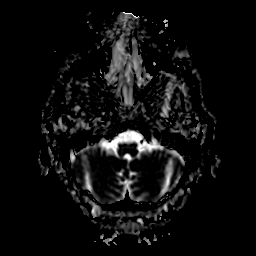
[im 18/52]
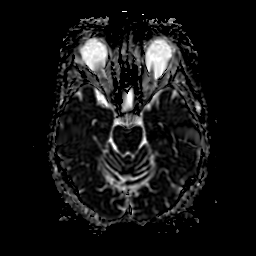
[im 26/52]
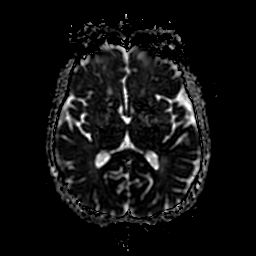
[im 35/52]
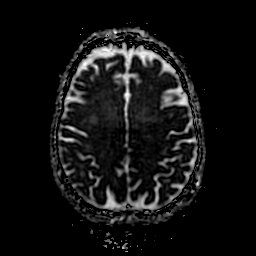
[im 43/52]
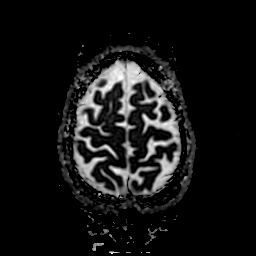
[im 52/52]
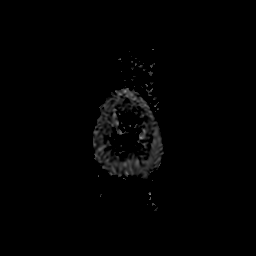

[Series 350: ADC · coronal · 4.0mm · 0.94mm/px · 5 of 37 slices shown (2 of 2)]
[im 1/37]
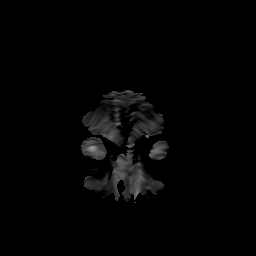
[im 10/37]
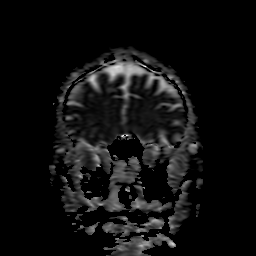
[im 19/37]
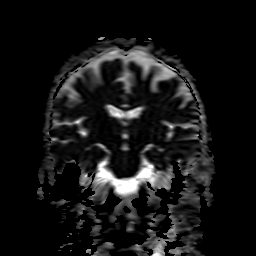
[im 28/37]
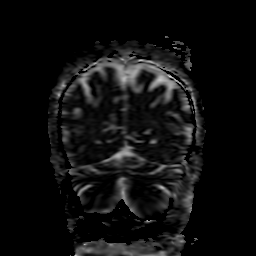
[im 37/37]
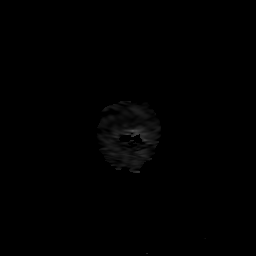

[43 of 48 positions shown; findings below may reference images not displayed]

FINDINGS: Limited protocol per neurology.  DWI and SWI only performed.

Small area of restricted diffusion in the left middle frontal cortex
compatible with acute infarct unchanged.

Patchy diffusion abnormality in the left temporoparietal white
matter unchanged most likely due to subacute infarct.

Small area of restricted diffusion in the left occipital parietal
cortex compatible with acute infarct which is new since earlier
today.

No intracranial hemorrhage identified.
IMPRESSION: New area of acute infarct in the left occipital parietal cortex
which was not present earlier today.

No change in small area of restricted diffusion in the left mid
frontal cortex and in the left temporoparietal white matter
compatible with recent infarct.

## 2019-12-08 IMAGING — MR MR MRA HEAD W/O CM
1 series · 20 of 48 positions shown · non-contrast
Comparison: None.

CLINICAL DATA: Aphasia

EXAM:
MRA HEAD WITHOUT CONTRAST
TECHNIQUE: Angiographic images of the Circle of Willis were obtained using MRA
technique without intravenous contrast.

[Series 5: 3d cow · axial · 0.5mm · 0.41mm/px · z∈[-132,-45]mm · 20 of 188 slices shown]
[im 1/188]
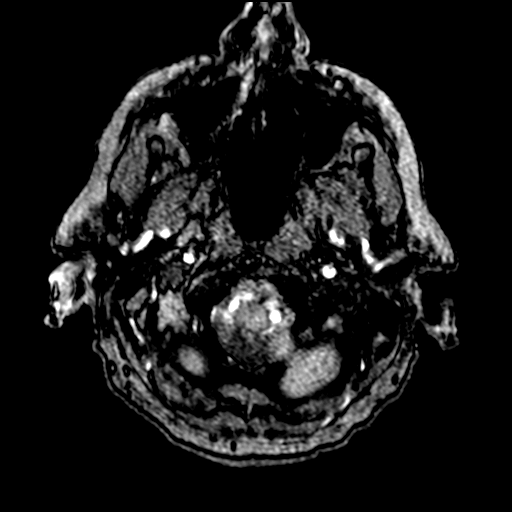
[im 4/188]
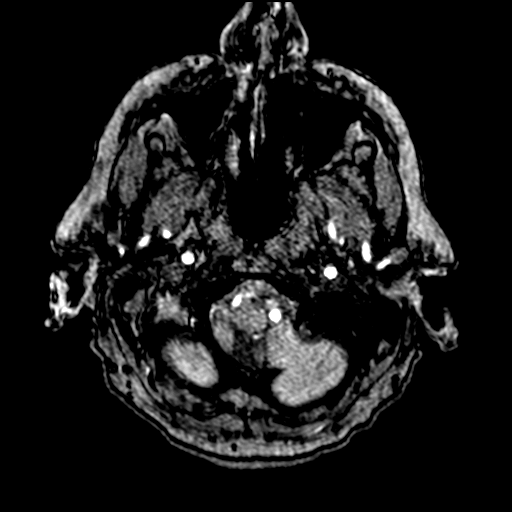
[im 8/188]
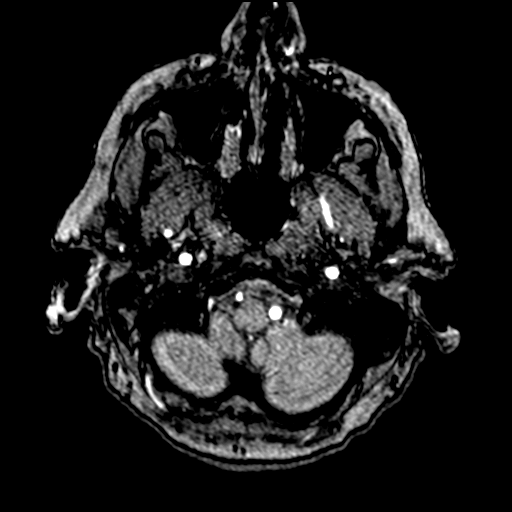
[im 12/188]
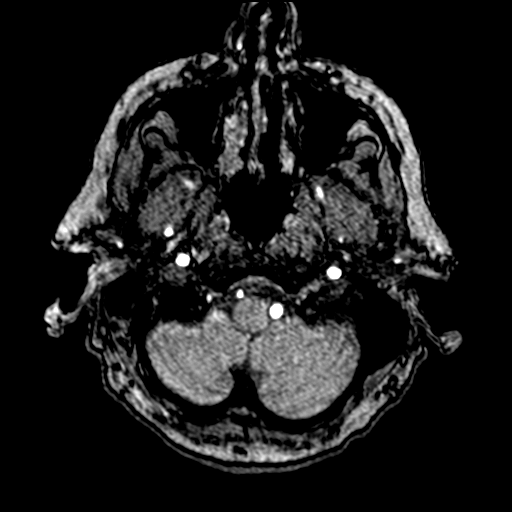
[im 16/188]
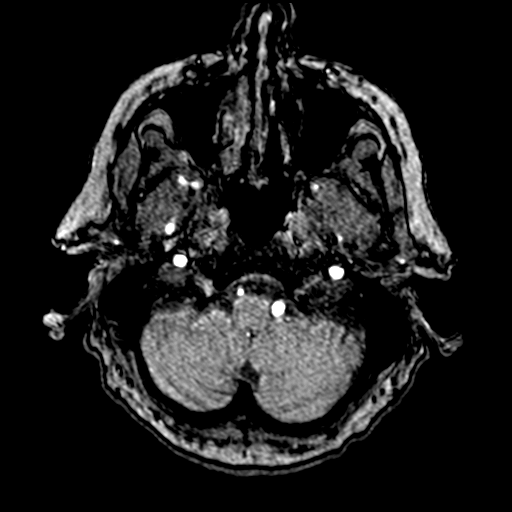
[im 20/188]
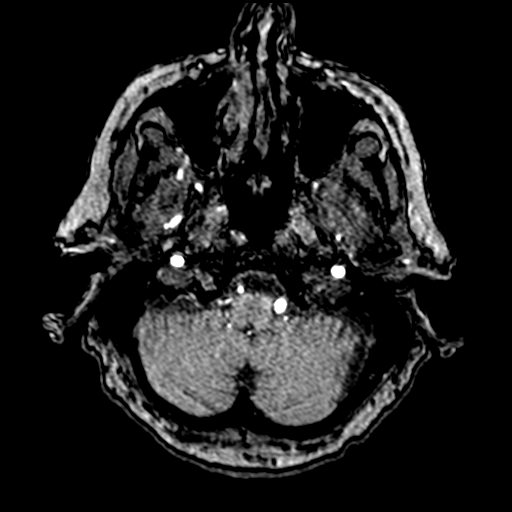
[im 24/188]
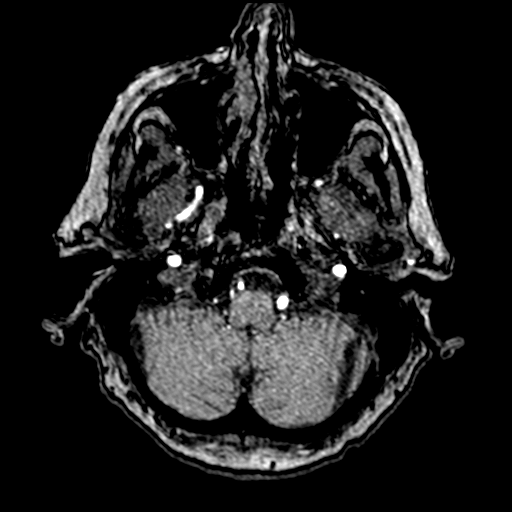
[im 28/188]
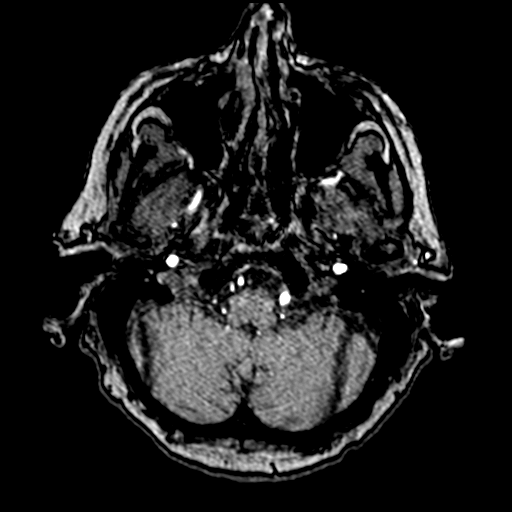
[im 32/188]
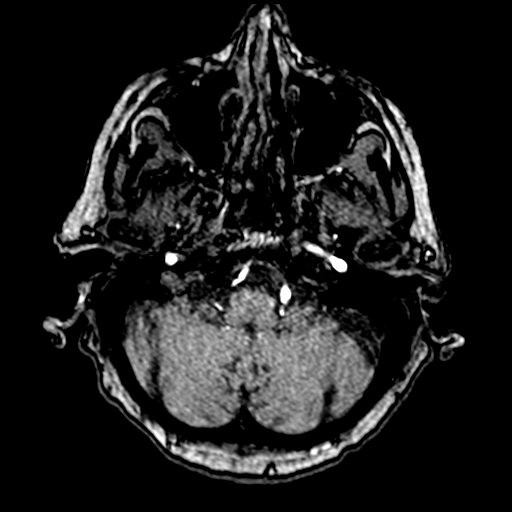
[im 36/188]
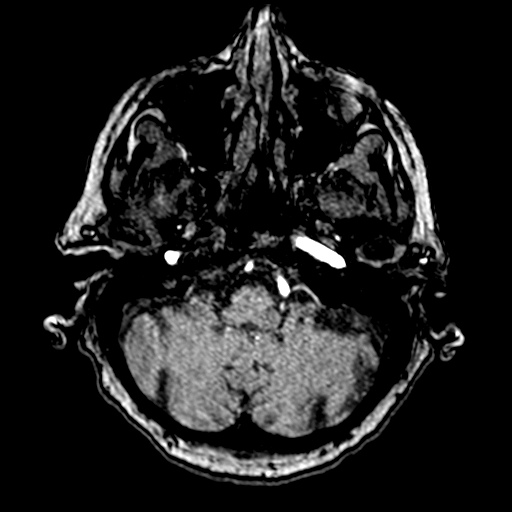
[im 40/188]
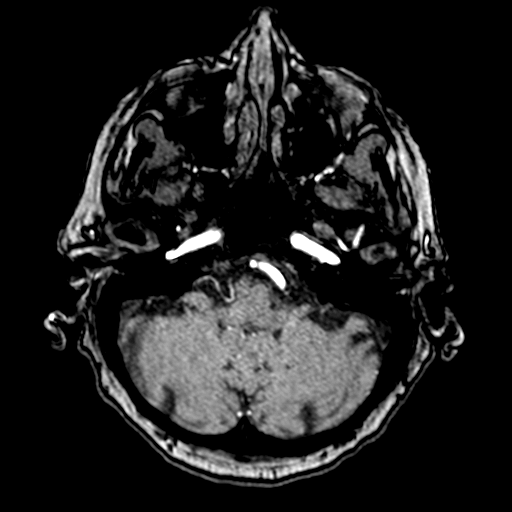
[im 44/188]
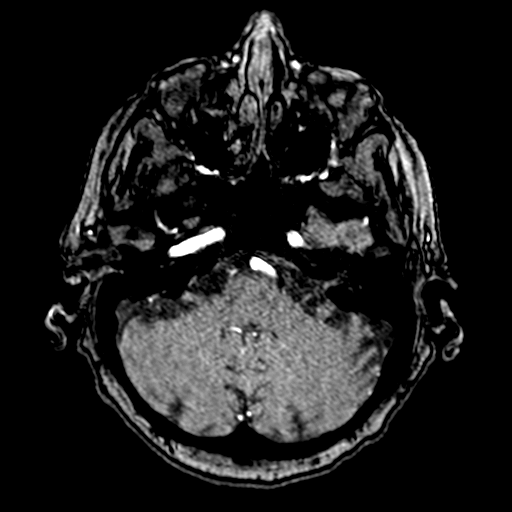
[im 60/188]
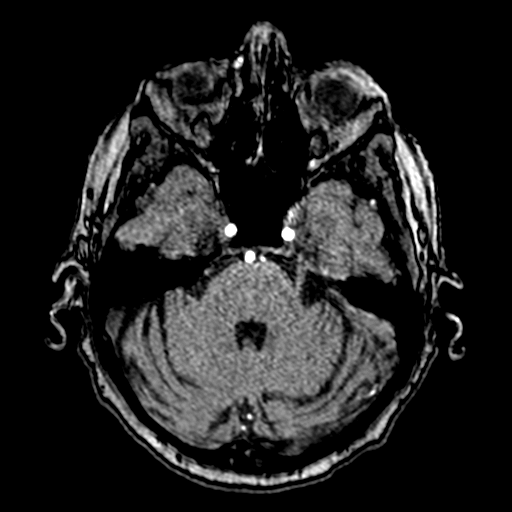
[im 84/188]
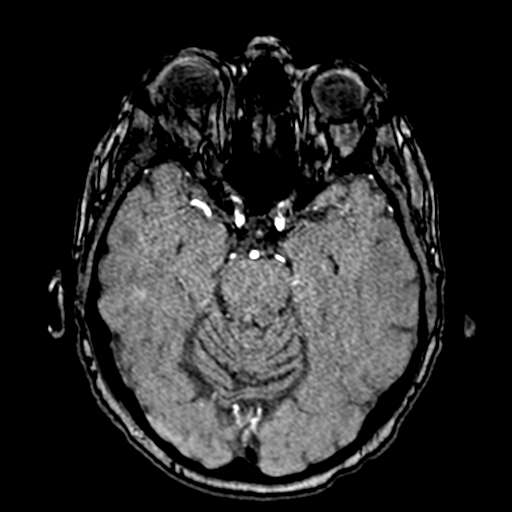
[im 96/188]
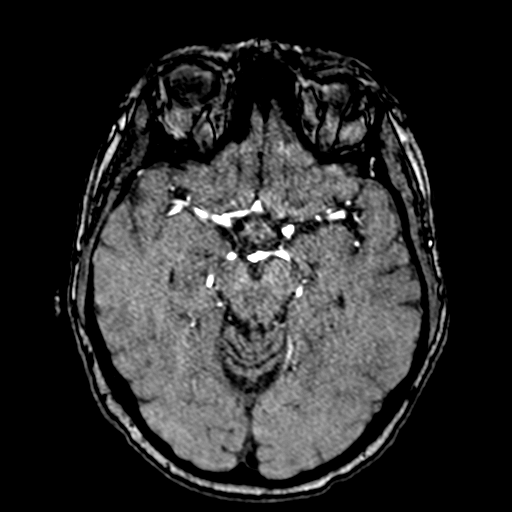
[im 108/188]
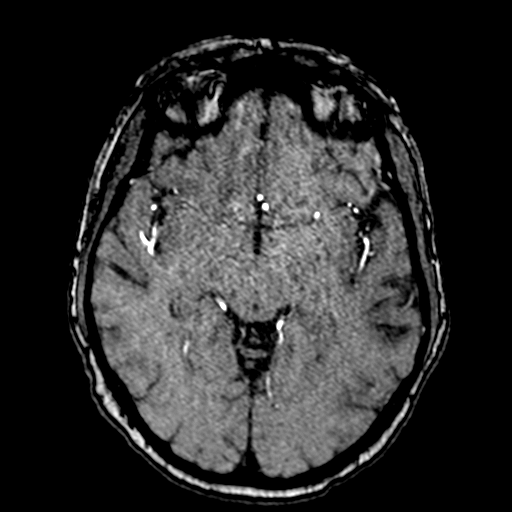
[im 132/188]
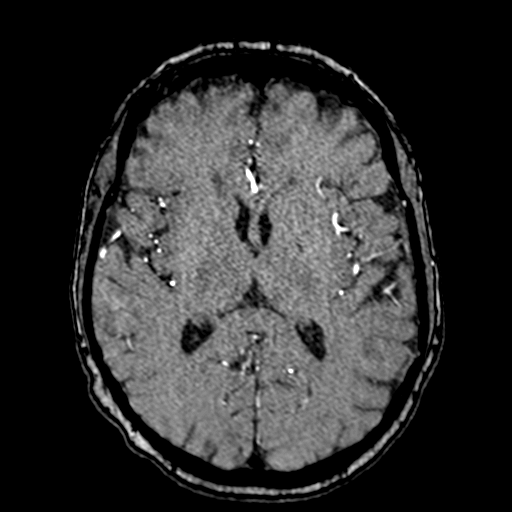
[im 156/188]
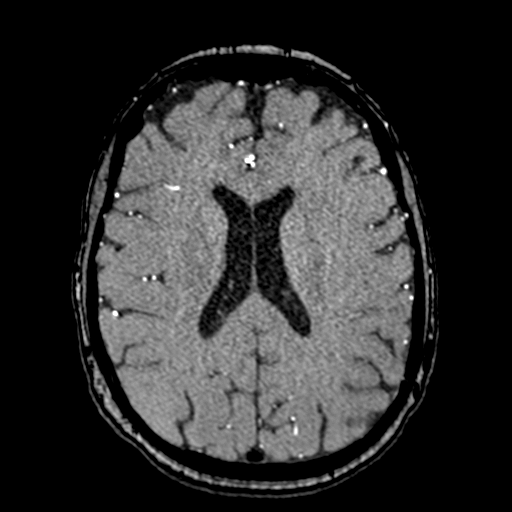
[im 160/188]
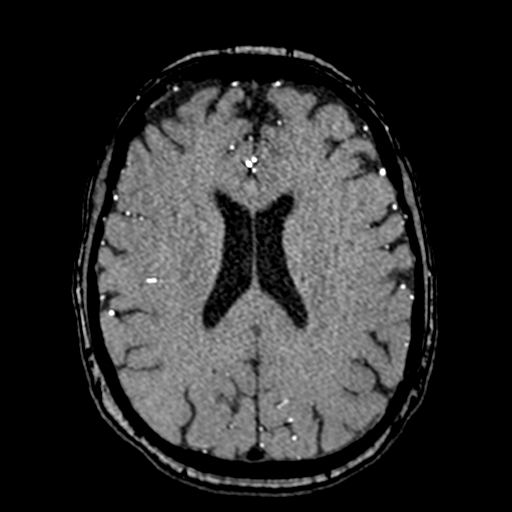
[im 180/188]
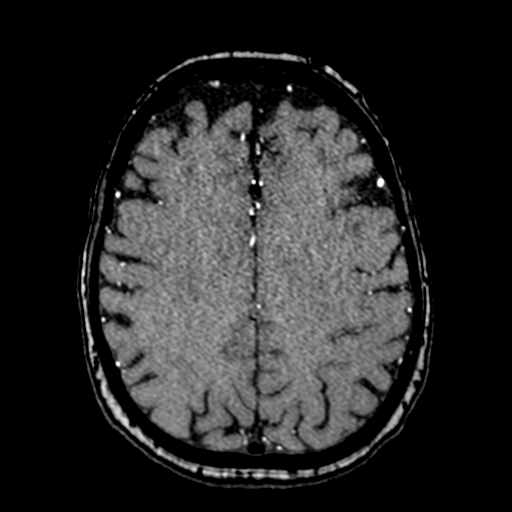

[20 of 48 positions shown; findings below may reference images not displayed]

FINDINGS: POSTERIOR CIRCULATION:

--Vertebral arteries: Normal V4 segments.

--Inferior cerebellar arteries: Normal.

--Basilar artery: Normal.

--Superior cerebellar arteries: Normal.

--Posterior cerebral arteries: Normal.

ANTERIOR CIRCULATION:

--Intracranial internal carotid arteries: Normal.

--Anterior cerebral arteries (ACA): Normal. Both A1 segments are
present. Patent anterior communicating artery (a-comm).

--Middle cerebral arteries (MCA): There is a short segment occlusion
of the left MCA M2 segment (image 86). The right MCA is normal.
IMPRESSION: Short segment occlusion of the left MCA M2 segment.

Critical Value/emergent results were called by telephone at the time
of interpretation on [DATE] at [DATE] to provider KAZEL
, who verbally acknowledged these results.

## 2019-12-08 IMAGING — DX DG CHEST 1V PORT
1 series · 1 of 1 positions shown · non-contrast
Comparison: [DATE]

CLINICAL DATA: Nausea, vomiting, diaphoresis

EXAM:
PORTABLE CHEST 1 VIEW

[chest ap]
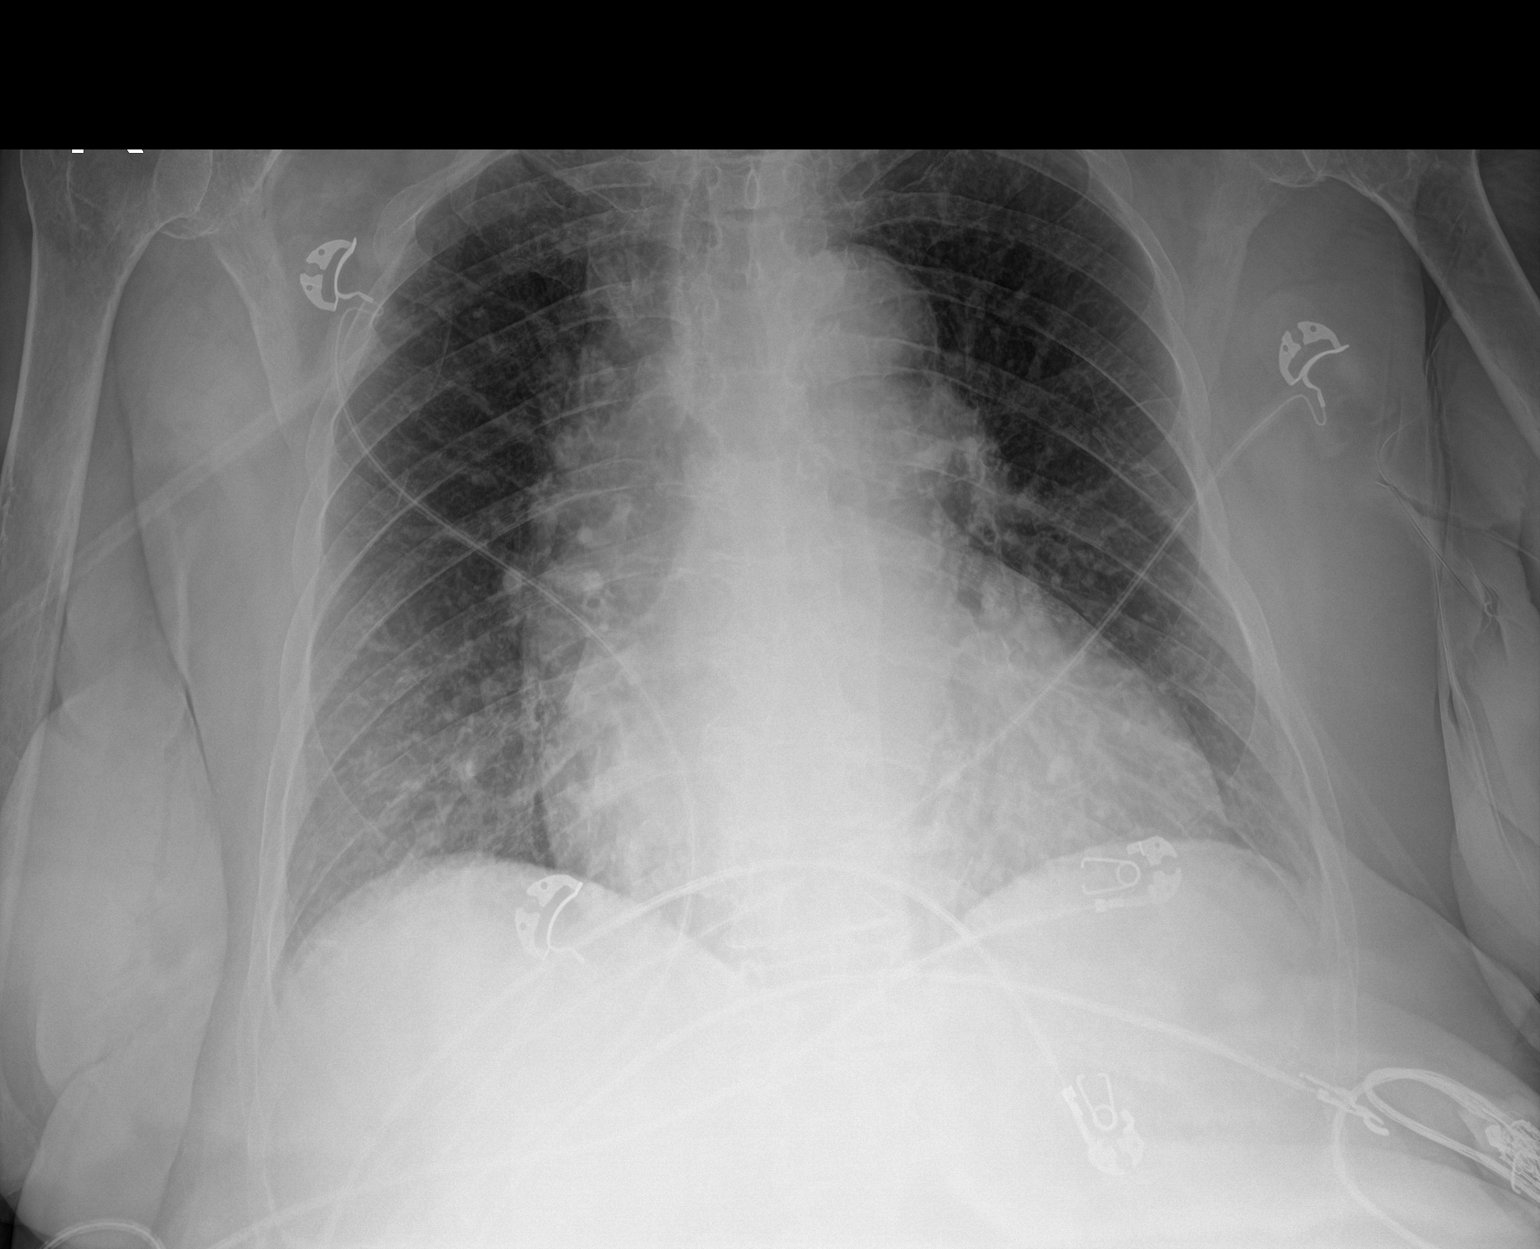

[1 of 1 positions shown; findings below may reference images not displayed]

FINDINGS: Lungs are clear. No pneumothorax or pleural effusion. There is mild
cardiac enlargement, new since prior examination, which may in part
be related to patient positioning on this semi upright chest
radiograph. The pulmonary vascularity is normal. No acute bone
abnormality.
IMPRESSION: Possible interval development of mild cardiomegaly. A standard two
view chest radiograph would be helpful in confirming true
enlargement since prior exam.

## 2019-12-08 MED ORDER — ASPIRIN EC 325 MG PO TBEC
325.0000 mg | DELAYED_RELEASE_TABLET | Freq: Every day | ORAL | Status: DC
  Administered 2019-12-08 – 2019-12-09 (×2): 325 mg via ORAL
  Filled 2019-12-08 (×2): qty 1

## 2019-12-08 MED ORDER — METOPROLOL TARTRATE 25 MG PO TABS
12.5000 mg | ORAL_TABLET | Freq: Once | ORAL | Status: AC
  Administered 2019-12-08: 12.5 mg via ORAL
  Filled 2019-12-08: qty 1

## 2019-12-08 MED ORDER — ACETAMINOPHEN 160 MG/5ML PO SOLN: 650.0000 mg | ORAL | Status: DC | PRN

## 2019-12-08 MED ORDER — ACETAMINOPHEN 650 MG RE SUPP: 650.0000 mg | RECTAL | Status: DC | PRN

## 2019-12-08 MED ORDER — CHLORHEXIDINE GLUCONATE CLOTH 2 % EX PADS
6.0000 | MEDICATED_PAD | Freq: Every day | CUTANEOUS | Status: DC
  Administered 2019-12-08 – 2019-12-11 (×4): 6 via TOPICAL

## 2019-12-08 MED ORDER — SODIUM CHLORIDE 0.9 % IV SOLN: INTRAVENOUS | Status: DC

## 2019-12-08 MED ORDER — METOPROLOL TARTRATE 25 MG PO TABS
12.5000 mg | ORAL_TABLET | Freq: Two times a day (BID) | ORAL | Status: DC
  Administered 2019-12-08: 12.5 mg via ORAL
  Filled 2019-12-08: qty 1

## 2019-12-08 MED ORDER — METOPROLOL TARTRATE 25 MG PO TABS
25.0000 mg | ORAL_TABLET | Freq: Two times a day (BID) | ORAL | Status: DC
  Administered 2019-12-08 – 2019-12-09 (×2): 25 mg via ORAL
  Filled 2019-12-08 (×2): qty 1

## 2019-12-08 MED ORDER — IOHEXOL 350 MG/ML SOLN
100.0000 mL | Freq: Once | INTRAVENOUS | Status: AC | PRN
  Administered 2019-12-08: 100 mL via INTRAVENOUS

## 2019-12-08 MED ORDER — ROSUVASTATIN CALCIUM 20 MG PO TABS
20.0000 mg | ORAL_TABLET | Freq: Every day | ORAL | Status: DC
  Administered 2019-12-08 – 2019-12-11 (×4): 20 mg via ORAL
  Filled 2019-12-08 (×4): qty 1

## 2019-12-08 MED ORDER — LACTATED RINGERS IV BOLUS
1000.0000 mL | Freq: Once | INTRAVENOUS | Status: AC
  Administered 2019-12-08: 1000 mL via INTRAVENOUS

## 2019-12-08 MED ORDER — LORAZEPAM 2 MG/ML IJ SOLN
1.0000 mg | Freq: Once | INTRAMUSCULAR | Status: AC
  Administered 2019-12-08: 1 mg via INTRAVENOUS
  Filled 2019-12-08: qty 1

## 2019-12-08 MED ORDER — ACETAMINOPHEN 325 MG PO TABS
650.0000 mg | ORAL_TABLET | ORAL | Status: DC | PRN
  Administered 2019-12-10: 650 mg via ORAL
  Filled 2019-12-08: qty 2

## 2019-12-08 MED ORDER — CLONAZEPAM 0.5 MG PO TABS
0.5000 mg | ORAL_TABLET | Freq: Every day | ORAL | Status: DC | PRN
  Administered 2019-12-08 – 2019-12-09 (×2): 0.5 mg via ORAL
  Filled 2019-12-08 (×2): qty 1

## 2019-12-08 MED ORDER — STROKE: EARLY STAGES OF RECOVERY BOOK
Freq: Once | Status: AC
  Filled 2019-12-08: qty 1

## 2019-12-08 NOTE — ED Triage Notes (Addendum)
Pt. Arrived [facility] from home with c/o nausea, vomiting, diaphoresis, aphasia. Per EMS, she went on a trip and when she came home, family stated that her talking was different. EMS noted expressive aphasia as well. [facility]- before trip. Hx. New onset a-fib, R dislocated shoulder.  Pt. Currently  A&O x4.   Vitals:   B/p: 210/120 HR 110 RR 18 SATS 96%

## 2019-12-08 NOTE — ED Notes (Signed)
Pt transported to xray 

## 2019-12-08 NOTE — Progress Notes (Signed)
VASCULAR LAB    Carotid duplex has been performed.  See CV proc for preliminary results.   Smita Lesh, RVT 12/08/2019, 5:18 PM

## 2019-12-08 NOTE — ED Provider Notes (Signed)
[facility] EMERGENCY DEPARTMENT Provider Note   CSN: [id number] Arrival date & time: 12/08/19  0021     History Chief Complaint  Patient presents with  . Nausea  . Emesis  . Aphasia    Stephanie Rodgers is a 79 y.o. female.  Patient not able to offer much history secondary to her mental status.  History is obtained from EMS and her daughter over the phone with the patient's permission.  Sounds like the patient has a history of hypertension hyperlipidemia and went on a trip a couple weeks ago and did not take her medication.  She started feeling bad on the trip with some emesis and just general feeling unwell.  No focal deficits otherwise but then apparently sometime in the last day or 2 the son with the check on her and noticed that she was not talking right and seemed a bit confused so they went to urgent care and noticed that she was short of breath.  They were worried for Covid and these other symptoms were brought here for further evaluation.  Patient not able to offer any history.  On evaluation she says things about her breathing being warm and cold.  Taking a bus causing her to be sweaty while hiking the [facility].  Level 5 caveat applies secondary to altered mental status.   Emesis Associated symptoms: no headaches   Neurologic Problem This is a new problem. The current episode started more than 2 days ago. The problem occurs constantly. The problem has not changed since onset.Pertinent negatives include no chest pain and no headaches. Nothing aggravates the symptoms. Nothing relieves the symptoms. She has tried nothing for the symptoms.       Past Medical History:  Diagnosis Date  . Depression   . Hyperlipidemia   . Hypertension     There are no problems to display for this patient.   No past surgical history on file.   OB History   No obstetric history on file.     No family history on file.  Social History   Tobacco Use  . Smoking  status: Not on file  Substance Use Topics  . Alcohol use: Not on file  . Drug use: Not on file    Home Medications Prior to Admission medications   Medication Sig Start Date End Date Taking? Authorizing Provider  amitriptyline (ELAVIL) 25 MG tablet Take by mouth.    [provider]  clonazePAM (KLONOPIN) 0.5 MG tablet TAKE ONE TABLET BY MOUTH EVERY NIGHT AT BEDTIME AS NEEDED FOR SLEEP 02/20/19   [provider]  LORazepam (ATIVAN) 0.5 MG tablet Take 0.5 mg by mouth every 8 (eight) hours.    [provider]  metoprolol tartrate (LOPRESSOR) 25 MG tablet Take 25 mg by mouth 2 (two) times daily.    [provider]  rosuvastatin (CRESTOR) 20 MG tablet Take 20 mg by mouth daily.    [provider]  sertraline (ZOLOFT) 50 MG tablet Take 50 mg by mouth daily.    [provider]    Allergies    Alendronate, Ciprofloxacin, Hydrochlorothiazide, Penicillins, and Colesevelam  Review of Systems   Review of Systems  Cardiovascular: Negative for chest pain.  Gastrointestinal: Positive for vomiting.  Neurological: Negative for headaches.  All other systems reviewed and are negative.   Physical Exam Updated Vital Signs BP (!) 153/122 (BP Location: Left Arm)   Pulse (!) 117   Temp 98.3 F (36.8 C) (Oral)  Resp 16   Ht 5\' 4"  (1.626 m)   Wt 81.6 kg   SpO2 97%   BMI 30.90 kg/m   Physical Exam Vitals and nursing note reviewed.  Constitutional:      Appearance: She is well-developed.  HENT:     Head: Normocephalic and atraumatic.     Mouth/Throat:     Mouth: Mucous membranes are moist.  Eyes:     Pupils: Pupils are equal, round, and reactive to light.  Cardiovascular:     Rate and Rhythm: Tachycardia present. Rhythm irregular.     Heart sounds: No murmur heard.  No friction rub.  Pulmonary:     Effort: No respiratory distress.     Breath sounds: No stridor.  Abdominal:     General: There is no distension.  Musculoskeletal:         General: No swelling or tenderness. Normal range of motion.     Cervical back: Normal range of motion.     Comments: Difficulty moving her right arm  Skin:    General: Skin is warm and dry.  Neurological:     Mental Status: She is alert. She is disoriented.     Cranial Nerves: No cranial nerve deficit.     Sensory: No sensory deficit.     Motor: No weakness.     Gait: Abnormal gait: deferred.     ED Results / Procedures / Treatments   Labs (all labs ordered are listed, but only abnormal results are displayed) Labs Reviewed  CBC - Abnormal; Notable for the following components:      Result Value   RDW 15.6 (*)    All other components within normal limits  COMPREHENSIVE METABOLIC PANEL - Abnormal; Notable for the following components:   CO2 16 (*)    Glucose, Bld 114 (*)    All other components within normal limits  I-STAT CHEM 8, ED - Abnormal; Notable for the following components:   Chloride 112 (*)    BUN 27 (*)    Glucose, Bld 111 (*)    Calcium, Ion 0.96 (*)    All other components within normal limits  SARS CORONAVIRUS 2 BY RT PCR (HOSPITAL ORDER, PERFORMED IN [facility])  PROTIME-INR  APTT  DIFFERENTIAL  LACTIC ACID, PLASMA  ETHANOL  RAPID URINE DRUG SCREEN, HOSP PERFORMED  URINALYSIS, ROUTINE W REFLEX MICROSCOPIC    EKG EKG Interpretation  Date/Time:  Sunday December 08 2019 00:33:25 EDT Ventricular Rate:  105 PR Interval:    QRS Duration: 87 QT Interval:  310 QTC Calculation: 410 R Axis:   65 Text Interpretation: Atrial fibrillation Borderline repolarization abnormality No old tracing to compare Confirmed by [phone] [id number]) on 12/08/2019 1:31:06 AM   Radiology CT HEAD WO CONTRAST  Result Date: 12/08/2019 CLINICAL DATA:  Expressive aphasia EXAM: CT HEAD WITHOUT CONTRAST TECHNIQUE: Contiguous axial images were obtained from the base of the skull through the vertex without intravenous contrast. COMPARISON:  None. FINDINGS: Brain:  There is no mass, hemorrhage or extra-axial collection. The size and configuration of the ventricles and extra-axial CSF spaces are normal. The brain parenchyma is normal, without acute or chronic infarction. Vascular: No abnormal hyperdensity of the major intracranial arteries or dural venous sinuses. No intracranial atherosclerosis. Skull: The visualized skull base, calvarium and extracranial soft tissues are normal. Sinuses/Orbits: No fluid levels or advanced mucosal thickening of the visualized paranasal sinuses. No mastoid or middle ear effusion. The orbits are normal. IMPRESSION: Normal head CT. Electronically  Signed   By: Deatra Robinson M.D.   On: 12/08/2019 01:44    Procedures .Critical Care Performed by: Marily Memos, MD Authorized by: Marily Memos, MD   Critical care provider statement:    Critical care time (minutes):  45   Critical care was necessary to treat or prevent imminent or life-threatening deterioration of the following conditions:  CNS failure or compromise   Critical care was time spent personally by me on the following activities:  Discussions with consultants, evaluation of patient's response to treatment, examination of patient, ordering and performing treatments and interventions, ordering and review of laboratory studies, ordering and review of radiographic studies, pulse oximetry, re-evaluation of patient's condition, obtaining history from patient or surrogate and review of old charts   (including critical care time)  Medications Ordered in ED Medications - No data to display  ED Course  I have reviewed the triage vital signs and the nursing notes.  Pertinent labs & imaging results that were available during my care of the patient were reviewed by me and considered in my medical decision making (see chart for details).    MDM Rules/Calculators/A&P                          Patient with confabulation and word finding difficulties.  Patient is awake and alert  without fever or other evidence of infectious causes.  Plan to rule out any metabolic causes for confusion and altered mental status but also has new onset atrial fibrillation and not on anticoagulation so very high concern for a neurologic cause.  CT head ordered.  Will consult neurology depending on results.  Patient initially thought that the last known well known.  However subsequently the daughter found out that the patient actually started to have an aphasia around 2200 yesterday.  However at the time of that information became available patient was well outside the window for TPA and was not a candidate for any interventional control skin change options.  Discussed with urology who saw the patient and will admit.  he deferred anticoagulation at this time and will address while in the hospital.  CHA2DS2/VAS Stroke Risk Points      N/A >= 2 Points: High Risk  1 - 1.99 Points: Medium Risk  0 Points: Low Risk    Last Change: N/A      This score determines the patient's risk of having a stroke if the  patient has atrial fibrillation.      This score is not applicable to this patient. Components are not  calculated.      Final Clinical Impression(s) / ED Diagnoses Final diagnoses:  None    Rx / DC Orders ED Discharge Orders    None       Jeson Camacho, Barbara Cower, MD 12/08/19 [id number]

## 2019-12-08 NOTE — ED Notes (Signed)
Pt returned from xray

## 2019-12-08 NOTE — H&P (Signed)
Chief Complaint: Aphasia  History obtained from: Patient and Chart     HPI:                                                                                                                                       Stephanie Rodgers is a 79 y.o. female with past medical history significant for hypertension, hyperlipidemia, depression/anxiety presents to [facility] emergency department for aphasia and slurred speech.  Last known normal 10 PM, however it is initially stated as several days ago before her trip.  According to EMS patient went on a trip for several days and when she came back home, she was talking different.  Code stroke not activated as it was assumed that stroke occurred several days ago.  However on speaking with the daughter at bedside, her speech suddenly changed around 10 PM last night when she had slurred speech and word salad.  She has been feeling sick since her trip with nausea, dizziness.  On arrival to [facility] , it was noted by nurse that she is having waxing and waning aphasia.  Stat CT head was obtained which was unremarkable.  Patient was taken for MRI brain which showed a punctate infarct in the left MCA territory as well as a subacute left frontal white matter territory infarct.  While patient was in the MRI scanner-we were made aware the change occurred at 10 PM instead of several days ago.  MRI was performed which showed a proximal left M2 occlusion.  On assessment after she returned from MRI, NIH stroke scale was 3 for mild expressive aphasia, sensory and visual neglect.  Patient also noted to be in atrial fibrillation in the emergency department, currently in rate control.   Date last known well: 12/07/19 Time last known well: 10 PM tPA Given: No, outside TPA window NIHSS: 3 Baseline MRS 0   Past Medical History:  Diagnosis Date  . Depression   . Hyperlipidemia   . Hypertension     No past surgical history on file.  No family history on file. Social  History:  has no history on file for tobacco use, alcohol use, and drug use.  Allergies:  Allergies  Allergen Reactions  . Alendronate Other (See Comments)    Edema, joint pain Edema, joint pain   . Ciprofloxacin Other (See Comments)    Abdominal pain, fatigue Abdominal pain, fatigue   . Hydrochlorothiazide Other (See Comments)    gout gout   . Penicillins Other (See Comments) and Rash    Welts Welts Welts Welts   . Colesevelam Other (See Comments)    unknown    Medications:  I reviewed home medications   ROS:                                                                                                                                     14 systems reviewed and negative except above    Examination:                                                                                                      General: Appears well-developed  Psych: Affect appropriate to situation Eyes: No scleral injection HENT: No OP obstrucion Head: Normocephalic.  Cardiovascular: Normal rate and regular rhythm.  Respiratory: Effort normal and breath sounds normal to anterior ascultation GI: Soft.  No distension. There is no tenderness.  Skin: WDI    Neurological Examination Mental Status: Alert, oriented, thought content appropriate.  Patient has minimal expressive aphasia, able to name objects and repeat sentences.  While speaking sentences sometimes stops to think of the right word.  Able to follow 3 step commands without difficulty. Cranial Nerves: II: Visual fields grossly normal,  III,IV, VI: ptosis not present, extra-ocular motions intact bilaterally, pupils equal, round, reactive to light and accommodation V,VII: smile symmetric, facial light touch sensation normal bilaterally VIII: hearing normal bilaterally IX,X: uvula rises symmetrically XI: bilateral  shoulder shrug XII: midline tongue extension Motor: Right : Upper extremity   5/5    Left:     Upper extremity   5/5  Lower extremity   5/5     Lower extremity   5/5 Tone and bulk:normal tone throughout; no atrophy noted Sensory: Neglect is double simultaneous testing Deep Tendon Reflexes: 2+ and symmetric throughout Plantars: Right: downgoing   Left: downgoing Cerebellar: normal finger-to-nose, normal rapid alternating movements and normal heel-to-shin test    Lab Results: Basic Metabolic Panel: Recent Labs  Lab 12/08/19 0112 12/08/19 0122  NA 139 140  K 4.4 4.6  CL 109 112*  CO2 16*  --   GLUCOSE 114* 111*  BUN 19 27*  CREATININE 0.72 0.60  CALCIUM 8.9  --     CBC: Recent Labs  Lab 12/08/19 0112 12/08/19 0122  WBC 8.8  --   NEUTROABS 6.4  --   HGB 12.8 13.9  HCT 40.8 41.0  MCV 94.7  --   PLT 223  --     Coagulation Studies: Recent Labs    12/08/19 0112  LABPROT 13.3  INR 1.1    Imaging: CT HEAD WO CONTRAST  Result Date: 12/08/2019 CLINICAL DATA:  Expressive aphasia EXAM: CT HEAD WITHOUT CONTRAST TECHNIQUE: Contiguous axial images were obtained from the base of the skull through the vertex without intravenous contrast. COMPARISON:  None. FINDINGS: Brain: There is no mass, hemorrhage or extra-axial collection. The size and configuration of the ventricles and extra-axial CSF spaces are normal. The brain parenchyma is normal, without acute or chronic infarction. Vascular: No abnormal hyperdensity of the major intracranial arteries or dural venous sinuses. No intracranial atherosclerosis. Skull: The visualized skull base, calvarium and extracranial soft tissues are normal. Sinuses/Orbits: No fluid levels or advanced mucosal thickening of the visualized paranasal sinuses. No mastoid or middle ear effusion. The orbits are normal. IMPRESSION: Normal head CT. Electronically Signed   By: Deatra Robinson M.D.   On: 12/08/2019 01:44   MR ANGIO HEAD WO CONTRAST  Result  Date: 12/08/2019 CLINICAL DATA:  Aphasia EXAM: MRA HEAD WITHOUT CONTRAST TECHNIQUE: Angiographic images of the Circle of Willis were obtained using MRA technique without intravenous contrast. COMPARISON:  None. FINDINGS: POSTERIOR CIRCULATION: --Vertebral arteries: Normal V4 segments. --Inferior cerebellar arteries: Normal. --Basilar artery: Normal. --Superior cerebellar arteries: Normal. --Posterior cerebral arteries: Normal. ANTERIOR CIRCULATION: --Intracranial internal carotid arteries: Normal. --Anterior cerebral arteries (ACA): Normal. Both A1 segments are present. Patent anterior communicating artery (a-comm). --Middle cerebral arteries (MCA): There is a short segment occlusion of the left MCA M2 segment (image 86). The right MCA is normal. IMPRESSION: Short segment occlusion of the left MCA M2 segment. Critical Value/emergent results were called by telephone at the time of interpretation on 12/08/2019 at 5:39 am to provider Georgiana Spinner Marny Smethers , who verbally acknowledged these results. Electronically Signed   By: Deatra Robinson M.D.   On: 12/08/2019 05:39   MR ANGIO NECK WO CONTRAST  Result Date: 12/08/2019 CLINICAL DATA:  Aphasia EXAM: MRA NECK WITHOUT CONTRAST TECHNIQUE: Angiographic images of the neck were obtained using MRA technique without intravenous contrast. Carotid stenosis measurements (when applicable) are obtained utilizing NASCET criteria, using the distal internal carotid diameter as the denominator. COMPARISON:  None. FINDINGS: Left dominant vertebral arteries. Both vertebral arteries are normal to the vertebrobasilar confluence. The origins are poorly visualized. No carotid stenosis. Normal 3 vessel aortic branching pattern. IMPRESSION: Normal MRA of the neck. Electronically Signed   By: Deatra Robinson M.D.   On: 12/08/2019 05:42   MR BRAIN WO CONTRAST  Result Date: 12/08/2019 CLINICAL DATA:  Aphasia EXAM: MRI HEAD WITHOUT CONTRAST TECHNIQUE: Multiplanar, multiecho pulse sequences of the  brain and surrounding structures were obtained without intravenous contrast. COMPARISON:  None. FINDINGS: Brain: There is a punctate focus of abnormal diffusion restriction in the left frontal operculum. There is a focus of mild hyperintensity on diffusion-weighted imaging within the left parietotemporal periatrial white matter. No corresponding ADC deficit. Multifocal hyperintense T2-weighted signal within the white matter. Normal volume of CSF spaces. No chronic microhemorrhage. Normal midline structures. Vascular: Normal flow voids. Skull and upper cervical spine: Normal marrow signal. Sinuses/Orbits: Negative. Other: None. IMPRESSION: 1. Punctate acute infarct within the left frontal operculum, which could contribute to aphasia. 2. Possible small subacute infarct of the left periatrial white matter. Electronically Signed   By: Deatra Robinson M.D.   On: 12/08/2019 05:15   DG Chest Portable 1 View  Result Date: 12/08/2019 CLINICAL DATA:  Nausea, vomiting, diaphoresis EXAM: PORTABLE CHEST 1 VIEW COMPARISON:  02/09/2015 FINDINGS: Lungs are clear. No pneumothorax or pleural effusion. There is mild cardiac enlargement, new since prior examination, which may in part be related to patient positioning on this semi upright  chest radiograph. The pulmonary vascularity is normal. No acute bone abnormality. IMPRESSION: Possible interval development of mild cardiomegaly. A standard two view chest radiograph would be helpful in confirming true enlargement since prior exam. Electronically Signed   By: Helyn Numbers MD   On: 12/08/2019 02:26     ASSESSMENT AND PLAN  78-year female with past menstrual history significant for hypertension hyperlipidemia presents with fluctuating elevations setting, left M2 occlusion.  Will obtain CT perfusion to evaluate progression.  Will admit to ICU for close monitoring and frequent neurochecks currently not can for IV TPA as she is outside the window clinically too good for neuro  IR  Acute left MCA stroke secondary to left M2 occlusion #CT perfusion to evaluate for core/penumbra #Transthoracic Echo  # Start patient on ASA 325mg  daily #Start or continue Atorvastatin 80 mg/other high intensity statin # BP goal: permissive HTN upto 220/120 mmHg ( 185/110 if patient has CHF, CKD) # HBAIC and Lipid profile # Telemetry monitoring # Frequent neuro checks # NPO until passes stroke swallow screen   New onset atrial fibrillation -Need to start anticoagulation before discharge -As needed labetalol for RVR/heart rate greater than 130  Hypertension -Permissive hypertension up to 185/110 mmHg  Hyperlipidemia -Lipid profile ordered -Atorvastatin 80 mg  Please page stroke NP  Or  PA  Or MD from 8am -4 pm  as this patient from this time will be  followed by the stroke.   You can look them up on [email or web address]  Password [email or web address]   This patient is neurologically critically ill due to acute stroke secondary to left M2 occlusion. She is at risk for significant risk of neurological worsening from cerebral edema,  death from brain herniation, heart failure, hemorrhagic conversion, infection, respiratory failure and seizure. This patient's care requires constant monitoring of vital signs, hemodynamics, respiratory and cardiac monitoring, review of multiple databases, neurological assessment, discussion with family, other specialists and medical decision making of high complexity.  I spent 55 minutes of neurocritical time in the care of this patient.    Ronnetta Currington [facility] Pager Number [phone]

## 2019-12-08 NOTE — Progress Notes (Signed)
*  PRELIMINARY RESULTS* Echocardiogram 2D Echocardiogram has been performed.  Jeryl Columbia 12/08/2019, 4:16 PM

## 2019-12-08 NOTE — Progress Notes (Addendum)
STROKE TEAM PROGRESS NOTE   INTERVAL HISTORY Her daughter is at the bedside.  Patient lying in bed, pleasant, awake alert, no significance aphasia, follow commands.  MRI showed patchy faint DWI changes concerning for stroke.  CT head and neck showed left M2 segmental occlusion versus high-grade stenosis.  Patient BP 150s, heart rate 120s in A. fib rhythm.  Will restart home metoprolol but given lower dose to avoid low BP.  Permissive hypertension.  Per daughter, patient has fluctuation symptoms.  Currently doing better than overnight.  Patient had a trip with 100s of people from 9/8-9/16.  She came back home and night of 9/16.  Currently Covid test was negative.  Daughter requested to check Covid test every day if possible or at least before going home because patient husband is 61 year old at home.  Both patient and her husband got Covid vaccination in March this year.  They did not receive any booster shots yet.   OBJECTIVE Vitals:   12/08/19 0708 12/08/19 0800 12/08/19 0830 12/08/19 0834  BP: (!) 129/102 (!) 165/111 (!) 149/93 (!) 162/136  Pulse:  95 (!) 124 (!) 105  Resp: (!) 31 (!) 29 (!) 22   Temp:    98.1 F (36.7 C)  TempSrc:    Oral  SpO2:  95% 96%   Weight:      Height:        CBC:  Recent Labs  Lab 12/08/19 0112 12/08/19 0122  WBC 8.8  --   NEUTROABS 6.4  --   HGB 12.8 13.9  HCT 40.8 41.0  MCV 94.7  --   PLT 223  --     Basic Metabolic Panel:  Recent Labs  Lab 12/08/19 0112 12/08/19 0122  NA 139 140  K 4.4 4.6  CL 109 112*  CO2 16*  --   GLUCOSE 114* 111*  BUN 19 27*  CREATININE 0.72 0.60  CALCIUM 8.9  --     Lipid Panel: No results found for: CHOL, TRIG, HDL, CHOLHDL, VLDL, LDLCALC HgbA1c: No results found for: HGBA1C Urine Drug Screen: No results found for: LABOPIA, COCAINSCRNUR, LABBENZ, AMPHETMU, THCU, LABBARB  Alcohol Level     Component Value Date/Time   ETH <10 12/08/2019 0111    IMAGING  DG Chest 2 View 12/08/2019 IMPRESSION:  1.  Increased size of the cardiomediastinal silhouette since 2016. Pericardial effusion is on the differential. Recommend correlation with echocardiography.  2. Diffuse interstitial prominence is favored to reflect underlying pulmonary edema. Additional differential considerations include atypical infection versus pulmonary fibrosis.   CT HEAD WO CONTRAST 12/08/2019 IMPRESSION:  Normal head CT.   MR ANGIO HEAD WO CONTRAST 12/08/2019 IMPRESSION:  Short segment occlusion of the left MCA M2 segment.   MR ANGIO NECK WO CONTRAST 12/08/2019 IMPRESSION:  Normal MRA of the neck.   MR BRAIN WO CONTRAST 12/08/2019 IMPRESSION:  1. Punctate acute infarct within the left frontal operculum, which could contribute to aphasia.  2. Possible small subacute infarct of the left periatrial white matter.   CT CEREBRAL PERFUSION W CONTRAST 12/08/2019 IMPRESSION:  Negative for core infarct. There is 31 mL of delayed perfusion on T-max greater than 4 seconds involving the left temporoparietal lobe. There is clot in the inferior division of the left MCA on CTA.   DG Chest Portable 1 View 12/08/2019 IMPRESSION: Possible interval development of mild cardiomegaly. A standard two view chest radiograph would be helpful in confirming true enlargement since prior exam.   CT ANGIO HEAD CODE STROKE 12/08/2019  IMPRESSION:  There is clot in two branches of the inferior division left MCA which are nonocclusive. Probable emboli. Superior division left MCA patent. Left M1 segment patent. No other intracranial stenosis or large vessel occlusion   Transthoracic Echocardiogram  00/00/2021 Pending  Bilateral Carotid Dopplers  00/00/2021 Pending  ECG - atrial fibrillation - ventricular response 105 BPM (See cardiology reading for complete details)  PHYSICAL EXAM  Temp:  [98.1 F (36.7 C)-98.3 F (36.8 C)] 98.1 F (36.7 C) (09/19 0834) Pulse Rate:  [73-145] 145 (09/19 1021) Resp:  [16-31] 23 (09/19 1000) BP:  (129-165)/(93-136) 155/104 (09/19 1021) SpO2:  [94 %-97 %] 94 % (09/19 1000) Weight:  [81.6 kg] 81.6 kg (09/19 0043)  General - Well nourished, well developed, in no apparent distress.  Ophthalmologic - fundi not visualized due to noncooperation.  Cardiovascular - irregularly irregular heart rate and rhythm with RVR  Mental Status -  Level of arousal and orientation to time, place, and person were intact. Language including expression, repetition, comprehension was assessed and found intact. Naming 3/4 Fund of Knowledge was assessed and was intact.  Cranial Nerves II - XII - II - Visual field intact OU, but with possible right simultanagnosia . III, IV, VI - Extraocular movements intact. V - Facial sensation intact bilaterally. VII - Facial movement intact bilaterally. VIII - Hearing & vestibular intact bilaterally. X - Palate elevates symmetrically. XI - Chin turning & shoulder shrug intact bilaterally. XII - Tongue protrusion intact.  Motor Strength - The patient's strength was normal in all extremities and pronator drift was absent except old right shoulder limited ROM due to old should dislocation.  Bulk was normal and fasciculations were absent.   Motor Tone - Muscle tone was assessed at the neck and appendages and was normal.  Reflexes - The patient's reflexes were symmetrical in all extremities and she had no pathological reflexes.  Sensory - Light touch, temperature/pinprick were assessed and were symmetrical.    Coordination - The patient had normal movements in the hands with no ataxia or dysmetria.  Tremor was absent.  Gait and Station - deferred.    ASSESSMENT/PLAN Ms. Stephanie Rodgers is a 79 y.o. female with history of hypertension, hyperlipidemia, and depression/anxiety presenting with speech difficulties - noted to be in Afib in ER (new diagnosis)  She did not receive IV t-PA due to late presentation (>4.5 hours from time of onset)  Stroke: punctate and small  patchy acute left MCA infarcts - embolic secondary to new diagnosed atrial fibrillation.  CT head - normal head CT  MRI head - Punctate acute infarct within the left frontal operculum, and small subacute infarct of the left periatrial white matter.   MRI repeat - pending  MRA head - Short segment occlusion of the left MCA M2 segment.   MRA neck - normal  CTA Head - There is clot in two branches of the inferior division left MCA which are nonocclusive. Probable emboli. Superior division left MCA patent. Left M1 segment patent.    CT Perfusion - Negative for core infarct. There is 31 mL of delayed perfusion on T-max greater than 4 seconds involving the left temporoparietal lobe.   Carotid Doppler - pending  2D Echo - pending  [employer] Virus 2 - negative  LDL - pending  HgbA1c - pending  VTE prophylaxis - SCDs  No antithrombotic prior to admission, now on aspirin 325 mg daily. If MRI repeat shows no large infarct, will start [facility] after.  Patient  counseled to be compliant with her antithrombotic medications  Ongoing aggressive stroke risk factor management  Therapy recommendations:  pending  Disposition:  Pending  Afib RVR  New diagnosis  On tele and EKG  Home metoprolol 25 bid  Resume half dose of metoprolol 12.5 bid for now  Wills tart AC once MRI repeat shows no large infarct  Hypertension  Home BP meds: metoprolol ; irbesartan ; hydalazine  Resume half dose of home metoprolol   BP goal 140-160 x 24 hours . Permissive hypertension (OK if < 220/120) but gradually normalize in 3-5 days  . Orthostatic vital in am . Bed rest for today . Long-term BP goal 130-150 given left M2 high grade stenosis  Hyperlipidemia  Home Lipid lowering medication: Crestor 20 mg daily  LDL pending, goal < 70  Current lipid lowering medication: Crestor 20 mg daily  Continue statin at discharge  Other Stroke Risk Factors  Advanced age  Obesity, Body mass index is 30.9  kg/m., recommend weight loss, diet and exercise as appropriate   Family hx stroke - not on file  Other Active Problems  Code status - Full code  Recent trip with large crowd - covid vaccine in 05/2019 - current covid neg - daughter request repeat covid before returning home   Hospital day # 0  This patient is critically ill due to left MCA infarct with left M2 near occlusion, Afib RVR and at significant risk of neurological worsening, death form recurrent stroke, further left M2 occlusion, heart failure, seizure, hemorrhagic conversion. This patient's care requires constant monitoring of vital signs, hemodynamics, respiratory and cardiac monitoring, review of multiple databases, neurological assessment, discussion with family, other specialists and medical decision making of high complexity. I spent 30 minutes of neurocritical care time in the care of this patient. I had long discussion with daughter at bedside, updated pt current condition, treatment plan and potential prognosis, and answered all the questions. She expressed understanding and appreciation.   Marvel Plan, MD PhD Stroke Neurology 12/08/2019 12:39 PM   To contact Stroke Continuity provider, please refer to [email or web address]. After hours, contact General Neurology

## 2019-12-09 LAB — CBC
HCT: 40.7 % (ref 36.0–46.0)
Hemoglobin: 13.1 g/dL (ref 12.0–15.0)
MCH: 30 pg (ref 26.0–34.0)
MCHC: 32.2 g/dL (ref 30.0–36.0)
MCV: 93.3 fL (ref 80.0–100.0)
Platelets: 230 10*3/uL (ref 150–400)
RBC: 4.36 MIL/uL (ref 3.87–5.11)
RDW: 15.1 % (ref 11.5–15.5)
WBC: 8.5 10*3/uL (ref 4.0–10.5)
nRBC: 0 % (ref 0.0–0.2)

## 2019-12-09 LAB — BASIC METABOLIC PANEL
Anion gap: 11 (ref 5–15)
BUN: 15 mg/dL (ref 8–23)
CO2: 20 mmol/L — ABNORMAL LOW (ref 22–32)
Calcium: 8.7 mg/dL — ABNORMAL LOW (ref 8.9–10.3)
Chloride: 107 mmol/L (ref 98–111)
Creatinine, Ser: 0.79 mg/dL (ref 0.44–1.00)
GFR calc Af Amer: >60 mL/min (ref >60)
GFR calc non Af Amer: >60 mL/min (ref >60)
Glucose, Bld: 114 mg/dL — ABNORMAL HIGH (ref 70–99)
Potassium: 4.5 mmol/L (ref 3.5–5.1)
Sodium: 138 mmol/L (ref 135–145)

## 2019-12-09 LAB — LIPID PANEL
Cholesterol: 138 mg/dL (ref 0–200)
HDL: 39 mg/dL — ABNORMAL LOW (ref >40)
LDL Cholesterol: 78 mg/dL (ref 0–99)
Total CHOL/HDL Ratio: 3.5 RATIO
Triglycerides: 105 mg/dL (ref <150)
VLDL: 21 mg/dL (ref 0–40)

## 2019-12-09 LAB — HEMOGLOBIN A1C
Hgb A1c MFr Bld: 6 % — ABNORMAL HIGH (ref 4.8–5.6)
Mean Plasma Glucose: 125.5 mg/dL

## 2019-12-09 MED ORDER — APIXABAN 5 MG PO TABS
5.0000 mg | ORAL_TABLET | Freq: Two times a day (BID) | ORAL | Status: DC
  Administered 2019-12-09 – 2019-12-11 (×5): 5 mg via ORAL
  Filled 2019-12-09 (×5): qty 1

## 2019-12-09 MED ORDER — METOPROLOL TARTRATE 25 MG PO TABS
37.5000 mg | ORAL_TABLET | Freq: Two times a day (BID) | ORAL | Status: DC
  Administered 2019-12-09 – 2019-12-10 (×2): 37.5 mg via ORAL
  Filled 2019-12-09: qty 2
  Filled 2019-12-09: qty 1

## 2019-12-09 NOTE — Discharge Instructions (Addendum)

## 2019-12-09 NOTE — Evaluation (Signed)
Speech Language Pathology Evaluation Patient Details Name: Stephanie Rodgers MRN: [id number] DOB: 1941/01/29 Today's Date: 12/09/2019 Time: 7096-2836 SLP Time Calculation (min) (ACUTE ONLY): 33 min  Problem List:  Patient Active Problem List   Diagnosis Date Noted  . Cerebral embolism with cerebral infarction 12/08/2019  . Stroke (cerebrum) (HCC) 12/08/2019  . Acute ischemic left MCA stroke (HCC) 12/08/2019   Past Medical History:  Past Medical History:  Diagnosis Date  . Depression   . Hyperlipidemia   . Hypertension    Past Surgical History: History reviewed. No pertinent surgical history. HPI:  Stephanie Rodgers is a 79 y.o. female with past medical history significant for hypertension, hyperlipidemia, depression/anxiety presents to [facility] emergency department for aphasia and slurred speech. Patient was taken for MRI brain which showed a punctate infarct in the left frontal operculum as well as a subacute left frontal white matter territory infarct. Repeat MRI showed new area of acute infarct in the left occipital parietal cortex. NIH stroke scale was 3 for mild expressive aphasia, sensory and visual neglect.   Assessment / Plan / Recommendation Clinical Impression  Pt was attentive and cooperative during SLE. She participated in a variety of tasks that revealed deficits in several areas. Her emergent awareness is in tact, however, her intellectual awareness is impaired. Although she could identify when she was making an error, her problem solving and self-correcting were impaired. She also demonstrated signs of decreased short term memory. Her auditory comprehension was impaired, particularly when given multi-step directions. The pt's performance on a clock drawing task indicated visuospatial deficits vs difficulty planning and organizing complex tasks. Her verbal expression was tangential and she demonstrated difficulty with topic maintenance. Recommend that the pt receive f/u SLP to  target cognitive-linguistic deficits. Given impairments likely to impact independence with functional ADLs, pt would benefit from supervision and assist in home or at assisted living facility as well as intensive SLP f/u.     SLP Assessment  SLP Recommendation/Assessment: Patient needs continued Speech Lanaguage Pathology Services SLP Visit Diagnosis: Aphasia (R47.01);Cognitive communication deficit (R41.841)    Follow Up Recommendations  24 hour supervision/assistance;Outpatient SLP    Frequency and Duration min 2x/week  2 weeks      SLP Evaluation Cognition  Overall Cognitive Status: Impaired/Different from baseline Arousal/Alertness: Awake/alert Orientation Level: Oriented to person;Oriented to situation;Oriented to place Attention: Focused;Sustained Focused Attention: Appears intact Sustained Attention: Appears intact Memory: Impaired Memory Impairment: Decreased short term memory Decreased Short Term Memory: Verbal basic;Functional basic Awareness: Impaired Awareness Impairment: Intellectual impairment Problem Solving: Impaired Problem Solving Impairment: Functional complex Executive Function: Organizing;Sequencing;Self Correcting;Decision Making Sequencing: Impaired Sequencing Impairment: Functional complex;Verbal complex Organizing: Impaired Organizing Impairment: Verbal complex;Functional complex Decision Making: Impaired Decision Making Impairment: Verbal complex;Functional complex Self Correcting: Impaired Self Correcting Impairment: Verbal complex;Functional complex Safety/Judgment: Impaired       Comprehension  Auditory Comprehension Overall Auditory Comprehension: Impaired Yes/No Questions: Within Functional Limits Commands: Impaired Multistep Basic Commands: 50-74% accurate Conversation: Simple Visual Recognition/Discrimination Discrimination: Exceptions to [facility] Other Visual Recogniton/Discrimination Comments:  (performance on clock drawing task  impaired) Reading Comprehension Reading Status: Within funtional limits    Expression Expression Primary Mode of Expression: Verbal Verbal Expression Overall Verbal Expression: Impaired Initiation: No impairment Automatic Speech: Social Response;Day of week Level of Generative/Spontaneous Verbalization: Word;Phrase;Sentence;Conversation Repetition: Impaired Level of Impairment: Sentence level Naming: No impairment Pragmatics: Impairment Impairments: Topic appropriateness;Topic maintenance Written Expression Dominant Hand: Left   Oral / Motor  Motor Speech Overall Motor Speech: Appears within functional limits  for tasks assessed Respiration: Within functional limits Phonation: Normal Resonance: Within functional limits Articulation: Within functional limitis Intelligibility: Intelligible Motor Planning: Witnin functional limits Motor Speech Errors: Not applicable   GO                    Royetta Crochet 12/09/2019, 1:31 PM

## 2019-12-09 NOTE — Progress Notes (Addendum)
STROKE TEAM PROGRESS NOTE   INTERVAL HISTORY RN is at the bedside. Pt lying in bed, stated tiredness and fatigue but admitted had a good sleep. Still in afib RVR but HR 100s, better than yesterday. BP on the high end, orthostatic vital no hypotension. Will increase metoprolol dose. Repeat MRI showed few small scattered infarcts at left MCA, will start eliquis today.   OBJECTIVE Vitals:   12/09/19 0900 12/09/19 1000 12/09/19 1100 12/09/19 1200  BP: (!) 174/138 (!) 160/129 (!) 157/112 (!) 158/110  Pulse: 76 82 (!) 31 89  Resp: (!) 29 (!) 22 (!) 24 20  Temp:      TempSrc:      SpO2: 90% 93% 91% 98%  Weight:      Height:       CBC:  Recent Labs  Lab 12/08/19 0112 12/08/19 0112 12/08/19 0122 12/09/19 0323  WBC 8.8  --   --  8.5  NEUTROABS 6.4  --   --   --   HGB 12.8   < > 13.9 13.1  HCT 40.8   < > 41.0 40.7  MCV 94.7  --   --  93.3  PLT 223  --   --  230   < > = values in this interval not displayed.   Basic Metabolic Panel:  Recent Labs  Lab 12/08/19 0112 12/08/19 0112 12/08/19 0122 12/09/19 0323  NA 139   < > 140 138  K 4.4   < > 4.6 4.5  CL 109   < > 112* 107  CO2 16*  --   --  20*  GLUCOSE 114*   < > 111* 114*  BUN 19   < > 27* 15  CREATININE 0.72   < > 0.60 0.79  CALCIUM 8.9  --   --  8.7*   < > = values in this interval not displayed.   Lipid Panel:     Component Value Date/Time   CHOL 138 12/09/2019 0323   TRIG 105 12/09/2019 0323   HDL 39 (L) 12/09/2019 0323   CHOLHDL 3.5 12/09/2019 0323   VLDL 21 12/09/2019 0323   LDLCALC 78 12/09/2019 0323   HgbA1c:  Lab Results  Component Value Date   HGBA1C 6.0 (H) 12/09/2019   Urine Drug Screen: No results found for: LABOPIA, COCAINSCRNUR, LABBENZ, AMPHETMU, THCU, LABBARB  Alcohol Level     Component Value Date/Time   ETH <10 12/08/2019 0111    IMAGING DG Chest 2 View 12/08/2019 IMPRESSION:  1. Increased size of the cardiomediastinal silhouette since 2016. Pericardial effusion is on the differential.  Recommend correlation with echocardiography.  2. Diffuse interstitial prominence is favored to reflect underlying pulmonary edema. Additional differential considerations include atypical infection versus pulmonary fibrosis.   CT HEAD WO CONTRAST 12/08/2019 IMPRESSION:  Normal head CT.   MR ANGIO HEAD WO CONTRAST 12/08/2019 IMPRESSION:  Short segment occlusion of the left MCA M2 segment.   MR ANGIO NECK WO CONTRAST 12/08/2019 IMPRESSION:  Normal MRA of the neck.   MR BRAIN WO CONTRAST 12/08/2019  IMPRESSION:  1. Punctate acute infarct within the left frontal operculum, which could contribute to aphasia.  2. Possible small subacute infarct of the left periatrial white matter.   CT ANGIO HEAD CODE STROKE 12/08/2019 IMPRESSION:  There is clot in two branches of the inferior division left MCA which are nonocclusive. Probable emboli. Superior division left MCA patent. Left M1 segment patent. No other intracranial stenosis or large vessel occlusion   CT  CEREBRAL PERFUSION W CONTRAST 12/08/2019 IMPRESSION:  Negative for core infarct. There is 31 mL of delayed perfusion on T-max greater than 4 seconds involving the left temporoparietal lobe. There is clot in the inferior division of the left MCA on CTA.   MR BRAIN WO CONTRAST 12/08/2019  IMPRESSION:  New area of acute infarct in the left occipital parietal cortex which was not present earlier today. No change in small area of restricted diffusion in the left mid frontal cortex and in the left temporoparietal white matter compatible with recent infarct.   DG Chest Portable 1 View 12/08/2019 IMPRESSION: Possible interval development of mild cardiomegaly. A standard two view chest radiograph would be helpful in confirming true enlargement since prior exam.   Transthoracic Echocardiogram  12/08/2019 1. Left ventricular ejection fraction, by estimation, is 60 to 65%. The left ventricle has normal function. The left ventricle has no regional  wall motion abnormalities. There is moderate left ventricular hypertrophy. Left ventricular diastolic parameters are indeterminate.  2. Right ventricular systolic function is normal. The right ventricular size is normal.  3. Left atrial size was severely dilated.  4. The mitral valve is normal in structure. Mild mitral valve regurgitation. No evidence of mitral stenosis.  5. The aortic valve is tricuspid. There is mild calcification of the aortic valve. There is mild thickening of the aortic valve. Aortic valve regurgitation is not visualized. Mild aortic valve stenosis.  6. The inferior vena cava is dilated in size with >50% respiratory variability, suggesting right atrial pressure of 8 mmHg.   Bilateral Carotid Dopplers  12/08/2019 Right Carotid: The extracranial vessels were near-normal with only minimal wall thickening or plaque.  Left Carotid: The extracranial vessels were near-normal with only minimal wall thickening or plaque.  Vertebrals: Bilateral vertebral arteries demonstrate antegrade flow.  Subclavians: Normal flow hemodynamics were seen in bilateral subclavian arteries.   ECG - atrial fibrillation - ventricular response 105 BPM (See cardiology reading for complete details)   PHYSICAL EXAM   Temp:  [97.9 F (36.6 C)-98.6 F (37 C)] 98.1 F (36.7 C) (09/20 0800) Pulse Rate:  [31-138] 89 (09/20 1200) Resp:  [13-31] 20 (09/20 1200) BP: (102-174)/(55-152) 158/110 (09/20 1200) SpO2:  [90 %-99 %] 98 % (09/20 1200)  General - Well nourished, well developed, in no apparent distress.  Ophthalmologic - fundi not visualized due to noncooperation.  Cardiovascular - irregularly irregular heart rate and rhythm with RVR  Mental Status -  Level of arousal and orientation to time, place, and person were intact. Language including expression, naming repetition, comprehension was assessed and found intact.  Fund of Knowledge was assessed and was intact.  Cranial Nerves II - XII  - II - Visual field intact OU, but with possible right simultanagnosia . III, IV, VI - Extraocular movements intact. V - Facial sensation intact bilaterally. VII - Facial movement intact bilaterally. VIII - Hearing & vestibular intact bilaterally. X - Palate elevates symmetrically. XI - Chin turning & shoulder shrug intact bilaterally. XII - Tongue protrusion intact.  Motor Strength - The patient's strength was normal in all extremities and pronator drift was absent except old right shoulder limited ROM due to old should dislocation.  Bulk was normal and fasciculations were absent.   Motor Tone - Muscle tone was assessed at the neck and appendages and was normal.  Reflexes - The patient's reflexes were symmetrical in all extremities and she had no pathological reflexes.  Sensory - Light touch, temperature/pinprick were assessed and were symmetrical.  Coordination - The patient had normal movements in the hands with no ataxia or dysmetria.  Tremor was absent.  Gait and Station - deferred.    ASSESSMENT/PLAN Ms. Stephanie Rodgers is a 79 y.o. female with history of hypertension, hyperlipidemia, and depression/anxiety presenting with speech difficulties - noted to be in Afib in ER (new diagnosis). Admitted to ICU for monitoring (outsisde window for tPA).   Stroke: punctate and small patchy acute left MCA infarcts - embolic secondary to new diagnosed atrial fibrillation.  CT head - normal head CT  MRI head - Punctate acute infarct within the left frontal operculum, and small subacute infarct of the left periatrial white matter.   MRA head and neck - Short segment occlusion of the left MCA M2 segment.   CTA Head - There is clot in two branches of the inferior division left MCA which are nonocclusive. Probable emboli. Superior division left MCA patent. Left M1 segment patent.    CT Perfusion - Negative for core infarct. There is 31 mL of delayed perfusion on T-max greater than 4 seconds  involving the left temporoparietal lobe.   MRI repeat - new L occipital parietal cortex infarct. No change in L mid frontal cortex and L temporoparietal  White matter recent infarcts.   Carotid Doppler - B ICA 1-39% stenosis, VAs antegrade   2D Echo - EF 60-56%. LA severely dilated  [employer] Virus 2 - negative  LDL - 78  HgbA1c - 6.0  VTE prophylaxis - Eliquis   No antithrombotic prior to admission, was on aspirin 325 mg daily. As MRI repeat shows no large infarct, started Eliquis today  Therapy recommendations:  No OT     Disposition:  Pending  Afib RVR  New diagnosis  On tele and EKG  Home metoprolol 25 bid  Increase metoprolol to 37.5 bid  start  Eliquis today as MRI repeat shows no large infarct  outpt cardiology follow up will be needed  Hypertension  Home BP meds: metoprolol ; irbesartan ; hydalazine  On metoprolol 37.5   BP goal  < 180 . Orthostatic vitals no orthostatic hypotension . Long-term BP goal 130-150 given left M2 high grade stenosis  Hyperlipidemia  Home Lipid lowering medication: Crestor 20 mg daily  LDL 78, goal < 70  Current lipid lowering medication: Crestor 20 mg daily  Continue statin at discharge  Other Stroke Risk Factors  Advanced age  Obesity, Body mass index is 30.9 kg/m., recommend weight loss, diet and exercise as appropriate   Other Active Problems  Code status - Full code  Recent trip with large crowd 9/8-9/16- covid vaccine in 05/2019 - current covid neg - daughter request repeat covid before returning home  On Klonopin for sleep  Hospital day # 1  This patient is critically ill due to A. fib RVR, left MCA stroke, hypertensive urgency and at significant risk of neurological worsening, death form recurrent stroke, hemorrhagic conversion, heart failure, seizure. This patient's care requires constant monitoring of vital signs, hemodynamics, respiratory and cardiac monitoring, review of multiple databases,  neurological assessment, discussion with family, other specialists and medical decision making of high complexity. I spent 35 minutes of neurocritical care time in the care of this patient.   Marvel Plan, MD PhD Stroke Neurology 12/09/2019 12:39 PM   To contact Stroke Continuity provider, please refer to [email or web address]. After hours, contact General Neurology

## 2019-12-09 NOTE — Progress Notes (Signed)
ANTICOAGULATION CONSULT NOTE  Pharmacy Consult for apixaban Indication: atrial fibrillation  Labs: Recent Labs    12/08/19 0112 12/08/19 0112 12/08/19 0122 12/09/19 0323  HGB 12.8   < > 13.9 13.1  HCT 40.8  --  41.0 40.7  PLT 223  --   --  230  APTT 29  --   --   --   LABPROT 13.3  --   --   --   INR 1.1  --   --   --   CREATININE 0.72  --  0.60 0.79   < > = values in this interval not displayed.    Assessment: 89 yof presenting with acute ischemic stroke due to newly diagnosed afib, not given TPA due to late presentation outside of time window. Patient started on aspirin 325mg  daily initially, now d/c'd. Pharmacy consulted to start apixaban per Neurology to start today. Patient is not on anticoagulation PTA. CBC and SCr wnl. No active bleed issues reported.  Goal of Therapy:  Stroke prevention Monitor platelets by anticoagulation protocol: Yes   Plan:  Start apixaban 5mg  PO BID Monitor CBC, s/sx bleeding Pharmacy will sign off consult and monitor peripherally  , PharmD, BCPS Clinical Pharmacist 12/09/2019 10:23 AM

## 2019-12-09 NOTE — Evaluation (Signed)
Physical Therapy Evaluation Patient Details Name: Stephanie Rodgers MRN: [id number] DOB: 1941-02-25 Today's Date: 12/09/2019   History of Present Illness  Stephanie Rodgers is a 79 y.o. female with past medical history significant for hypertension, hyperlipidemia, depression/anxiety presents to [facility] emergency department for aphasia and slurred speech and new Afib. MRI brain which showed a punctate infarct in the left MCA territory as well as a subacute left frontal white matter territory infarct. MRI was performed which showed a proximal left M2 occlusion.  Clinical Impression  Patient presents with mobility not too far off her baseline, but has minor deficits in sitting balance and in R side awareness missing her room number at first while walking in hall then missing her cup of ice water on the tray.  She was independent prior to this admission and has family at home, so could do HHPT or outpatient PT;however daughter concerned for her mother's safety in the home environment, so likely best plan may be to transition into ALF for assistance with medication and safety.  PT to continue to follow acutely.     Follow Up Recommendations Outpatient PT;Home health PT (depending on plan for discharge and access to outpatient services)    Equipment Recommendations  None recommended by PT    Recommendations for Other Services       Precautions / Restrictions Precautions Precautions: Other (comment) Precaution Comments: watch HR (135 today)and BP (see flow sheets) Restrictions Weight Bearing Restrictions: No      Mobility  Bed Mobility Overal bed mobility: Needs Assistance Bed Mobility: Supine to Sit     Supine to sit: Supervision     General bed mobility comments: up to EOB with assist for lines  Transfers Overall transfer level: Needs assistance Equipment used: None Transfers: Sit to/from Stand Sit to Stand: Supervision         General transfer comment: assist for lines and  safety  Ambulation/Gait Ambulation/Gait assistance: Supervision;Min guard Gait Distance (Feet): 200 Feet Assistive device: None Gait Pattern/deviations: Step-through pattern;Decreased stride length     General Gait Details: no LOB in hallway, pt able to make quick stop, turn around and walk backwards and stop  Stairs            Wheelchair Mobility    Modified Rankin (Stroke Patients Only) Modified Rankin (Stroke Patients Only) Pre-Morbid Rankin Score: No symptoms Modified Rankin: Moderately severe disability     Balance Overall balance assessment: Needs assistance   Sitting balance-Leahy Scale: Fair Sitting balance - Comments: EOB noted drifting back and to L, but able to correct and able to cross leg to don socks at EOB   Standing balance support: No upper extremity supported;During functional activity Standing balance-Leahy Scale: Good Standing balance comment: stood at sink to wash face, brush teeth and clean perineal area                             Pertinent Vitals/Pain Pain Assessment: No/denies pain    Home Living Family/patient expects to be discharged to:: Private residence Living Arrangements: Spouse/significant other;Children (& son) Available Help at Discharge: Family Type of Home: House Home Access: Level entry ([place] home)     Home Layout: One level Home Equipment: Environmental consultant - 2 wheels;Cane - single point;Grab bars - tub/shower      Prior Function Level of Independence: Independent         Comments: Just [place] with [place] friends  Hand Dominance   Dominant Hand: Left    Extremity/Trunk Assessment   Upper Extremity Assessment Upper Extremity Assessment: Defer to OT evaluation    Lower Extremity Assessment Lower Extremity Assessment: RLE deficits/detail;LLE deficits/detail RLE Deficits / Details: AROM and strength WFL, but notes she has had both knees replaced and noted edema around pes anserine LLE  Deficits / Details: AROM and strength WFL, but notes she has had both knees replaced and noted edema around pes anserine       Communication   Communication: No difficulties  Cognition Arousal/Alertness: Awake/alert Behavior During Therapy: WFL for tasks assessed/performed Overall Cognitive Status: Impaired/Different from baseline Area of Impairment: Safety/judgement                         Safety/Judgement: Decreased awareness of deficits     General Comments: Per SLP decreased higher level cognition      General Comments General comments (skin integrity, edema, etc.): Daughter returned to room after session and voiced major concerns with pt going home stating she feels it is an unsafe environment.  HR max 136 a-fib w/ambulation, pt reporting diaphoretic more than usual    Exercises     Assessment/Plan    PT Assessment Patient needs continued PT services  PT Problem List Decreased mobility;Decreased balance;Decreased activity tolerance;Cardiopulmonary status limiting activity;Decreased safety awareness       PT Treatment Interventions DME instruction;Therapeutic activities;Cognitive remediation;Gait training;Balance training;Functional mobility training;Patient/family education    PT Goals (Current goals can be found in the Care Plan section)  Acute Rehab PT Goals Patient Stated Goal: to go home PT Goal Formulation: With patient Time For Goal Achievement: 12/23/19 Potential to Achieve Goals: Good    Frequency Min 4X/week   Barriers to discharge        Co-evaluation   Reason for Co-Treatment: To address functional/ADL transfers PT goals addressed during session: Mobility/safety with mobility;Balance;Strengthening/ROM OT goals addressed during session: Strengthening/ROM;ADL's and self-care       AM-PAC PT "6 Clicks" Mobility  Outcome Measure Help needed turning from your back to your side while in a flat bed without using bedrails?: None Help needed  moving from lying on your back to sitting on the side of a flat bed without using bedrails?: None Help needed moving to and from a bed to a chair (including a wheelchair)?: A Little Help needed standing up from a chair using your arms (e.g., wheelchair or bedside chair)?: None Help needed to walk in hospital room?: A Little Help needed climbing 3-5 steps with a railing? : A Little 6 Click Score: 21    End of Session Equipment Utilized During Treatment: Gait belt Activity Tolerance: Patient tolerated treatment well Patient left: with call bell/phone within reach;in chair;with chair alarm set   PT Visit Diagnosis: Other abnormalities of gait and mobility (R26.89);Other symptoms and signs involving the nervous system (R29.898)    Time: 2831-5176 PT Time Calculation (min) (ACUTE ONLY): 37 min   Charges:   PT Evaluation $PT Eval Moderate Complexity: 1 Mod          Cyndi Lelania Bia, PT Acute Rehabilitation Services Pager:[phone] Office:[phone] 12/09/2019   Elray Mcgregor 12/09/2019, 1:49 PM

## 2019-12-09 NOTE — TOC Initial Note (Signed)
Transition of Care [facility]) - Initial/Assessment Note    Patient Details  Name: Stephanie Rodgers MRN: [id number] Date of Birth: 03/26/1940  Transition of Care [facility]) CM/SW Contact:    Joanne Chars, LCSW Phone Number: 12/09/2019, 3:04 PM  Clinical Narrative:   CSW met with pt daughter, Ashely Royal, who is POA, at her request. Ashely will bring the POA to the hospital tomorrow.  Per Ashely, pt lives at home with her husband and her adult son.  Ashely reports husband (Ashley's father) has diabetes and would not be helpful in providing care.   Pt's son (Ashely's brother) has substance abuse issues and also would not be helpful.  Ashely was informed today that pt would be DC tomorrow with a HH recommendation and this is of great concern to her.  Ashely wanted to discuss referral to SNF and reports that she has a contact at [facility] who she has already spoken to about possible short term placement there.  CSW discussed with Ashely that if a SNF stay is not medically necessary, the family would need to self pay for the stay and Ashely is aware and hopeful that somehow that stay will be deemed medically necessary.  She mentions speech/cognative issues as one possible reason why it is needed.    Ashely is meeting with MD at [facility] today to discuss her concerns.  Pt is vaccinated.  Pt PCP, Dr Margretta Sidle, is recently retired and a new PCP has not yet been assigned.  Equipment: pt currently does not have any equipment in the home.              Expected Discharge Plan: [place] Barriers to Discharge: Continued Medical Work up, No SNF bed   Patient Goals and CMS Choice        Expected Discharge Plan and Services Expected Discharge Plan: [place] Choice:  (TBD) Living arrangements for the past 2 months: Single Family Home                                      Prior Living Arrangements/Services Living arrangements for the past 2 months:  Single Family Home Lives with:: Adult Children, Spouse Patient language and need for interpreter reviewed:: Yes Do you feel safe going back to the place where you live?: No   Daughter reports concerns about pt cognative ability to be in the home  Need for Family Participation in Patient Care: Yes (Comment) Care giver support system in place?: Yes (comment) (daughter)   Criminal Activity/Legal Involvement Pertinent to Current Situation/Hospitalization: No - Comment as needed  Activities of Daily Living Home Assistive Devices/Equipment: Eyeglasses, Dentures (specify type) (partial lower) ADL Screening (condition at time of admission) Patient's cognitive ability adequate to safely complete daily activities?: Yes Is the patient deaf or have difficulty hearing?: No Does the patient have difficulty seeing, even when wearing glasses/contacts?: No Does the patient have difficulty concentrating, remembering, or making decisions?: No Patient able to express need for assistance with ADLs?: Yes Does the patient have difficulty dressing or bathing?: No Independently performs ADLs?: No Communication: Independent Dressing (OT): Needs assistance Is this a change from baseline?: Change from baseline, expected to last <3days Grooming: Needs assistance Is this a change from baseline?: Change from baseline, expected to last <3 days Feeding: Needs assistance Is this a change from baseline?: Change  from baseline, expected to last <3 days Bathing: Needs assistance Is this a change from baseline?: Change from baseline, expected to last <3 days Toileting: Needs assistance Is this a change from baseline?: Change from baseline, expected to last <3 days In/Out Bed: Needs assistance Is this a change from baseline?: Change from baseline, expected to last <3 days Walks in Home: Needs assistance Is this a change from baseline?: Change from baseline, expected to last <3 days Does the patient have difficulty walking  or climbing stairs?: Yes Weakness of Legs: None Weakness of Arms/Hands: None  Permission Sought/Granted Permission sought to share information with : Facility Geographical information systems officer)                Emotional Assessment   Attitude/Demeanor/Rapport: Unable to Assess Affect (typically observed): Unable to Assess Orientation: : Oriented to Self, Oriented to Place, Oriented to Situation Alcohol / Substance Use: Not Applicable Psych Involvement: No (comment)  Admission diagnosis:  Stroke (cerebrum) (HCC) [I63.9] Acute ischemic left MCA stroke (HCC) [I63.512] Cerebrovascular accident (CVA), unspecified mechanism ([place]) [I63.9] Patient Active Problem List   Diagnosis Date Noted  . Cerebral embolism with cerebral infarction 12/08/2019  . Stroke (cerebrum) ([place]) 12/08/2019  . Acute ischemic left MCA stroke ([place]) 12/08/2019   PCP:  Rolland Porter, PA-C Pharmacy:   [facility], [place] - [address] [place] [address] Phone: [phone] Fax: [phone]     Social Determinants of Health (SDOH) Interventions    Readmission Risk Interventions No flowsheet data found.

## 2019-12-09 NOTE — Evaluation (Addendum)
Occupational Therapy Evaluation and Discharge Patient Details Name: Stephanie Rodgers MRN: [id number] DOB: May 24, 1940 Today's Date: 12/09/2019    History of Present Illness Stephanie Rodgers is a 79 y.o. female with past medical history significant for hypertension, hyperlipidemia, depression/anxiety presents to [facility] emergency department for aphasia and slurred speech and new Afib. MRI brain which showed a punctate infarct in the left MCA territory as well as a subacute left frontal white matter territory infarct. MRI was performed which showed a proximal left M2 occlusion.   Clinical Impression   This 79 yo female admitted with above presents to acute OT at a S level. Feel after she is up on her feet more she will progress quickly back to her baseline of independent, but can still benefit from [facility] for safety with basic ADLs and IADLs in her own home.  We will sign off.   Follow Up Recommendations  Home health OT;Supervision/Assistance - 24 hour    Equipment Recommendations  Tub/shower seat (pt to get on her own)       Precautions / Restrictions Precautions Precautions: Other (comment) Precaution Comments: watch HR (135 today)and BP (see flow sheets) Restrictions Weight Bearing Restrictions: No      Mobility Bed Mobility Overal bed mobility: Needs Assistance Bed Mobility: Supine to Sit     Supine to sit: Supervision        Transfers Overall transfer level: Needs assistance Equipment used: None Transfers: Sit to/from Stand Sit to Stand: Supervision              Balance Overall balance assessment: Modified Independent                                         ADL either performed or assessed with clinical judgement   ADL                                         General ADL Comments: Overall at a S level in this environment and has this at home from her husband,but also feel she will progress quickly back to her independent level.  We did discuss the safety of a shower seat and I sent her an email for the one that would be the best for her now wtih tub/shower and later when she gets a walk in shower (she reports her husband and she have been talking about it)     Vision Patient Visual Report: No change from baseline              Pertinent Vitals/Pain Pain Assessment: No/denies pain     Hand Dominance (P) Left   Extremity/Trunk Assessment Upper Extremity Assessment Upper Extremity Assessment:  (bad shoulders pta)           Communication Communication Communication: No difficulties   Cognition Arousal/Alertness: Awake/alert Behavior During Therapy: WFL for tasks assessed/performed Overall Cognitive Status: (P) Impaired/Different from baseline                                     General Comments  Just a little slower than normal speed for walking, but she has had both knees replaced. She was able to walk, turn 180 degrees, walk backwards a few steps and  turn back around            Home Living Family/patient expects to be discharged to:: Private residence Living Arrangements: Spouse/significant other Available Help at Discharge: (P) Family Type of Home: (P) House Home Access: Level entry     Home Layout: One level     Bathroom Shower/Tub: Chief Strategy Officer: Standard     Home Equipment: Environmental consultant - 2 wheels;Cane - single point;Grab bars - tub/shower      Lives With: (P) Spouse    Prior Functioning/Environment Level of Independence: Independent                 OT Problem List: Decreased range of motion         OT Goals(Current goals can be found in the care plan section) Acute Rehab OT Goals Patient Stated Goal: to go home  OT Frequency:             Co-evaluation PT/OT/SLP Co-Evaluation/Treatment: Yes Reason for Co-Treatment: To address functional/ADL transfers PT goals addressed during session: Mobility/safety with  mobility;Balance;Strengthening/ROM OT goals addressed during session: Strengthening/ROM;ADL's and self-care      AM-PAC OT "6 Clicks" Daily Activity     Outcome Measure Help from another person eating meals?: None Help from another person taking care of personal grooming?: A Little (S) Help from another person toileting, which includes using toliet, bedpan, or urinal?: A Little (S) Help from another person bathing (including washing, rinsing, drying)?: A Little (S) Help from another person to put on and taking off regular upper body clothing?: A Little (S) Help from another person to put on and taking off regular lower body clothing?: A Little (S) 6 Click Score: 19   End of Session Equipment Utilized During Treatment: Gait belt Nurse Communication: Mobility status  Activity Tolerance: Patient tolerated treatment well Patient left: in chair;with call bell/phone within reach;with chair alarm set  OT Visit Diagnosis: Unsteadiness on feet (R26.81)                Time: 5809-9833 OT Time Calculation (min): 37 min Charges:  OT General Charges $OT Visit: 1 Visit OT Evaluation $OT Eval Moderate Complexity: 1 Mod  Ignacia Palma, OTR/L Acute [employer] Pager [phone] Office [phone]     Evette Georges 12/09/2019, 1:29 PM

## 2019-12-10 LAB — BASIC METABOLIC PANEL
Anion gap: 10 (ref 5–15)
BUN: 12 mg/dL (ref 8–23)
CO2: 23 mmol/L (ref 22–32)
Calcium: 8.9 mg/dL (ref 8.9–10.3)
Chloride: 107 mmol/L (ref 98–111)
Creatinine, Ser: 0.67 mg/dL (ref 0.44–1.00)
GFR calc Af Amer: >60 mL/min (ref >60)
GFR calc non Af Amer: >60 mL/min (ref >60)
Glucose, Bld: 106 mg/dL — ABNORMAL HIGH (ref 70–99)
Potassium: 4.2 mmol/L (ref 3.5–5.1)
Sodium: 140 mmol/L (ref 135–145)

## 2019-12-10 LAB — CBC
HCT: 39.7 % (ref 36.0–46.0)
Hemoglobin: 12.9 g/dL (ref 12.0–15.0)
MCH: 29.9 pg (ref 26.0–34.0)
MCHC: 32.5 g/dL (ref 30.0–36.0)
MCV: 92.1 fL (ref 80.0–100.0)
Platelets: 224 10*3/uL (ref 150–400)
RBC: 4.31 MIL/uL (ref 3.87–5.11)
RDW: 14.8 % (ref 11.5–15.5)
WBC: 7.3 10*3/uL (ref 4.0–10.5)
nRBC: 0 % (ref 0.0–0.2)

## 2019-12-10 MED ORDER — BISACODYL 10 MG RE SUPP
10.0000 mg | Freq: Once | RECTAL | Status: AC
  Administered 2019-12-10: 10 mg via RECTAL
  Filled 2019-12-10: qty 1

## 2019-12-10 MED ORDER — BISACODYL 5 MG PO TBEC: 5.0000 mg | DELAYED_RELEASE_TABLET | Freq: Every day | ORAL | Status: DC | PRN

## 2019-12-10 MED ORDER — METOPROLOL TARTRATE 50 MG PO TABS
50.0000 mg | ORAL_TABLET | Freq: Two times a day (BID) | ORAL | Status: DC
  Administered 2019-12-10 – 2019-12-11 (×2): 50 mg via ORAL
  Filled 2019-12-10 (×2): qty 1

## 2019-12-10 MED ORDER — METOPROLOL TARTRATE 12.5 MG HALF TABLET
12.5000 mg | ORAL_TABLET | Freq: Once | ORAL | Status: AC
  Administered 2019-12-10: 12.5 mg via ORAL
  Filled 2019-12-10: qty 1

## 2019-12-10 NOTE — Progress Notes (Signed)
Physical Therapy Treatment Patient Details Name: Stephanie Rodgers MRN: [id number] DOB: [birth year] Today's Date: 12/10/2019    History of Present Illness Stephanie Rodgers is a 79 y.o. female with past medical history significant for hypertension, hyperlipidemia, depression/anxiety presents to [facility] emergency department for aphasia and slurred speech and new Afib. MRI brain which showed a punctate infarct in the left MCA territory as well as a subacute left frontal white matter territory infarct. MRI was performed which showed a proximal left M2 occlusion.    PT Comments    Pt continues to show deficits in cognition, balance, and R sided inattention.  She needed min assist for balance during gait, had 2/4 DOE (HR 120s-130s in A-fib), BP 150s systolically (within neuro MD parameters).  She is unsteady on her feet at high risk for falls and tends to run into obstacles on the right.  She is unable to list her deficits (with the exception of her cognitive deficits).  Her daughter is concerned about her returning home as it sounds like she was the primary home maker and caregiver for her husband with diabetes and her son.  She is at risk for falls, but I am hopeful to do some training with the RW next session to see if this increases her safety with gait or provides another obstacle making her a higher fall risk.  PT will continue to follow acutely for safe mobility progression.   Follow Up Recommendations  Home health PT     Equipment Recommendations  3in1 (PT);Rolling walker with 5" wheels    Recommendations for Other Services       Precautions / Restrictions Precautions Precautions: Fall;Other (comment) Precaution Comments: A-fib HR 110s-130s, BP goal <180 systolically.     Mobility  Bed Mobility Overal bed mobility: Needs Assistance Bed Mobility: Supine to Sit     Supine to sit: Supervision;HOB elevated     General bed mobility comments: Supervision for safety HOB elevated to ~40  degrees, pt nearly sitting on R hand with wrist underturned.   Transfers Overall transfer level: Needs assistance Equipment used: None Transfers: Sit to/from Stand Sit to Stand: Supervision         General transfer comment: Close supervision for safety. HR already up to 127 bpm in standing.    Ambulation/Gait Ambulation/Gait assistance: Min assist Gait Distance (Feet): 200 Feet Assistive device: 1 person hand held assist Gait Pattern/deviations: Step-through pattern;Staggering right;Drifts right/left Gait velocity: decreased Gait velocity interpretation: 1.31 - 2.62 ft/sec, indicative of limited community ambulator General Gait Details: Pt running into obstacles on her right hand side, staggering gait pattern requiring min assist for balance.  Does not like the idea of using a RW, but daughter able to convince her at end of session.    Stairs Stairs: Yes Stairs assistance: Supervision Stair Management: Two rails;Alternating pattern;Forwards Number of Stairs: 5 General stair comments: Pt able to preform stairs reciprocally with the support of both rails.  She reports she has a second story in her home, but does not have to go up there (guest rooms).    Wheelchair Mobility    Modified Rankin (Stroke Patients Only) Modified Rankin (Stroke Patients Only) Pre-Morbid Rankin Score: No symptoms Modified Rankin: Moderately severe disability     Balance Overall balance assessment: Needs assistance Sitting-balance support: Feet supported;No upper extremity supported Sitting balance-Leahy Scale: Fair Sitting balance - Comments: close supervision EOB.  Reached to the floor, but not steady min guard assist for safety when reaching.  Standing balance support: Single extremity supported Standing balance-Leahy Scale: Fair Standing balance comment: needs external support for dynamic balance challenges, supervision for static standing, pt preference of one hand assist.                              Cognition Arousal/Alertness: Awake/alert Behavior During Therapy: WFL for tasks assessed/performed Overall Cognitive Status: Impaired/Different from baseline Area of Impairment: Memory;Safety/judgement;Awareness                     Memory: Decreased short-term memory   Safety/Judgement: Decreased awareness of safety;Decreased awareness of deficits Awareness: Intellectual   General Comments: Pt has R sided inattention, balance deficits, and cognitive deficits and could only report to me the cognitive deficits.       Exercises      General Comments        Pertinent Vitals/Pain Pain Assessment: No/denies pain    Home Living                      Prior Function            PT Goals (current goals can now be found in the care plan section) Acute Rehab PT Goals Patient Stated Goal: to go home Progress towards PT goals: Progressing toward goals    Frequency    Min 4X/week      PT Plan Current plan remains appropriate    Co-evaluation              AM-PAC PT "6 Clicks" Mobility   Outcome Measure  Help needed turning from your back to your side while in a flat bed without using bedrails?: None Help needed moving from lying on your back to sitting on the side of a flat bed without using bedrails?: A Little Help needed moving to and from a bed to a chair (including a wheelchair)?: A Little Help needed standing up from a chair using your arms (e.g., wheelchair or bedside chair)?: A Little Help needed to walk in hospital room?: A Little Help needed climbing 3-5 steps with a railing? : None 6 Click Score: 20    End of Session Equipment Utilized During Treatment: Gait belt Activity Tolerance: Patient limited by fatigue Patient left: in bed;with family/visitor present;Other (comment) (seated EOB with daughter)   PT Visit Diagnosis: Other abnormalities of gait and mobility (R26.89);Other symptoms and signs involving the  nervous system (O17.510)     Time: 2585-2778 PT Time Calculation (min) (ACUTE ONLY): 35 min  Charges:  $Gait Training: 23-37 mins                     Corinna Capra, PT, DPT  Acute Rehabilitation [phone] pager [phone]) [phone] office

## 2019-12-10 NOTE — NC FL2 (Addendum)
[place] MEDICAID FL2 LEVEL OF CARE SCREENING TOOL     IDENTIFICATION  Patient Name: Stephanie Rodgers Birthdate: 1940-09-14 Sex: female Admission Date (Current Location): 12/08/2019  [facility] and [place] Number:  Producer, television/film/video and Address:  [facility] [place], [address] [place], [place] [address]      Provider Number: [id number]  Attending Physician Name and Address:  Marvel Plan, MD  Relative Name and Phone Number:  Greta Doom, Daughter   [phone]    Current Level of Care: Hospital Recommended Level of Care: Skilled Nursing Facility Prior Approval Number:    Date Approved/Denied:   PASRR Number: [id number]  Discharge Plan: SNF    Current Diagnoses: Patient Active Problem List   Diagnosis Date Noted   Cerebral embolism with cerebral infarction 12/08/2019   Stroke (cerebrum) (HCC) 12/08/2019   Acute ischemic left MCA stroke (HCC) 12/08/2019    Orientation RESPIRATION BLADDER Height & Weight     Self, Situation, Place  Normal Incontinent Weight: 180 lb (81.6 kg) Height:  5\' 4"  (162.6 cm)  BEHAVIORAL SYMPTOMS/MOOD NEUROLOGICAL BOWEL NUTRITION STATUS      Continent Diet (Heart healthy, carb modified.  See discharge summary)  AMBULATORY STATUS COMMUNICATION OF NEEDS Skin   Supervision Verbally Normal                       Personal Care Assistance Level of Assistance  Bathing, Feeding, Dressing Bathing Assistance: Independent Feeding assistance: Independent Dressing Assistance: Independent     Functional Limitations Info  Sight, Hearing, Speech Sight Info: Adequate Hearing Info: Adequate Speech Info: Adequate    SPECIAL CARE FACTORS FREQUENCY  PT (By licensed PT), OT (By licensed OT)     PT Frequency: 3x week OT Frequency: 3x weej            Contractures Contractures Info: Not present    Additional Factors Info  Code Status, Allergies Code Status Info: full Allergies Info: Alendronate, Ciprofloxacin,  Hydrochlorothiazide, Penicillins, Colesevelam           Current Medications (12/10/2019):  This is the current hospital active medication list Current Facility-Administered Medications  Medication Dose Route Frequency Provider Last Rate Last Admin   acetaminophen (TYLENOL) tablet 650 mg  650 mg Oral Q4H PRN Aroor, 12/12/2019, MD       Or   acetaminophen (TYLENOL) 160 MG/5ML solution 650 mg  650 mg Per Tube Q4H PRN Aroor, Dara Lords, MD       Or   acetaminophen (TYLENOL) suppository 650 mg  650 mg Rectal Q4H PRN Aroor, Dara Lords, MD       apixaban (ELIQUIS) tablet 5 mg  5 mg Oral BID von Dohlen, Haley B, RPH   5 mg at 12/10/19 0902   bisacodyl (DULCOLAX) EC tablet 5 mg  5 mg Oral Daily PRN 12/12/19, MD       Chlorhexidine Gluconate Cloth 2 % PADS 6 each  6 each Topical Daily Aroor, Marvel Plan, MD   6 each at 12/10/19 0904   clonazePAM (KLONOPIN) tablet 0.5 mg  0.5 mg Oral Daily PRN 12/12/19, MD   0.5 mg at 12/09/19 2124   metoprolol tartrate (LOPRESSOR) tablet 50 mg  50 mg Oral BID 2125, MD       rosuvastatin (CRESTOR) tablet 20 mg  20 mg Oral Daily Marvel Plan, MD   20 mg at 12/10/19 0901     Discharge Medications: Please see discharge summary for  a list of discharge medications.  Relevant Imaging Results:  Relevant Lab Results:   Additional Information SSN [id number]  Tivis Ringer, Student-Social Work

## 2019-12-10 NOTE — Progress Notes (Signed)
STROKE TEAM PROGRESS NOTE   INTERVAL HISTORY Daughter at bedside.  Patient awake alert, in good spirit.  Neurologically intact.  PT and OT recommend home health PT.  Daughter still seeking for SNF placement, requested signed, likely to be denied due to high functioning.  On Eliquis, tolerating well.  Still in A. fib, heart rate 90-110.  Increase metoprolol dose to 50 twice daily.  OBJECTIVE Vitals:   12/10/19 0000 12/10/19 0212 12/10/19 0430 12/10/19 0809  BP:  (!) 159/95  (!) 153/90  Pulse:  61  (!) 102  Resp:  18 19 16   Temp: 98.3 F (36.8 C) 98.2 F (36.8 C) 98 F (36.7 C) 98.2 F (36.8 C)  TempSrc: Oral Oral Oral Oral  SpO2:  96%  94%  Weight:      Height:       CBC:  Recent Labs  Lab 12/08/19 0112 12/08/19 0122 12/09/19 0323 12/10/19 0438  WBC 8.8   < > 8.5 7.3  NEUTROABS 6.4  --   --   --   HGB 12.8   < > 13.1 12.9  HCT 40.8   < > 40.7 39.7  MCV 94.7   < > 93.3 92.1  PLT 223   < > 230 224   < > = values in this interval not displayed.   Basic Metabolic Panel:  Recent Labs  Lab 12/09/19 0323 12/10/19 0438  NA 138 140  K 4.5 4.2  CL 107 107  CO2 20* 23  GLUCOSE 114* 106*  BUN 15 12  CREATININE 0.79 0.67  CALCIUM 8.7* 8.9   Lipid Panel:     Component Value Date/Time   CHOL 138 12/09/2019 0323   TRIG 105 12/09/2019 0323   HDL 39 (L) 12/09/2019 0323   CHOLHDL 3.5 12/09/2019 0323   VLDL 21 12/09/2019 0323   LDLCALC 78 12/09/2019 0323   HgbA1c:  Lab Results  Component Value Date   HGBA1C 6.0 (H) 12/09/2019   Urine Drug Screen: No results found for: LABOPIA, COCAINSCRNUR, LABBENZ, AMPHETMU, THCU, LABBARB  Alcohol Level     Component Value Date/Time   ETH <10 12/08/2019 0111    IMAGING DG Chest 2 View 12/08/2019 IMPRESSION:  1. Increased size of the cardiomediastinal silhouette since 2016. Pericardial effusion is on the differential. Recommend correlation with echocardiography.  2. Diffuse interstitial prominence is favored to reflect  underlying pulmonary edema. Additional differential considerations include atypical infection versus pulmonary fibrosis.   CT HEAD WO CONTRAST 12/08/2019 IMPRESSION:  Normal head CT.   MR ANGIO HEAD WO CONTRAST 12/08/2019 IMPRESSION:  Short segment occlusion of the left MCA M2 segment.   MR ANGIO NECK WO CONTRAST 12/08/2019 IMPRESSION:  Normal MRA of the neck.   MR BRAIN WO CONTRAST 12/08/2019  IMPRESSION:  1. Punctate acute infarct within the left frontal operculum, which could contribute to aphasia.  2. Possible small subacute infarct of the left periatrial white matter.   CT ANGIO HEAD CODE STROKE 12/08/2019 IMPRESSION:  There is clot in two branches of the inferior division left MCA which are nonocclusive. Probable emboli. Superior division left MCA patent. Left M1 segment patent. No other intracranial stenosis or large vessel occlusion   CT CEREBRAL PERFUSION W CONTRAST 12/08/2019 IMPRESSION:  Negative for core infarct. There is 31 mL of delayed perfusion on T-max greater than 4 seconds involving the left temporoparietal lobe. There is clot in the inferior division of the left MCA on CTA.   MR BRAIN WO CONTRAST 12/08/2019  IMPRESSION:  New area of acute infarct in the left occipital parietal cortex which was not present earlier today. No change in small area of restricted diffusion in the left mid frontal cortex and in the left temporoparietal white matter compatible with recent infarct.   DG Chest Portable 1 View 12/08/2019 IMPRESSION: Possible interval development of mild cardiomegaly. A standard two view chest radiograph would be helpful in confirming true enlargement since prior exam.   Transthoracic Echocardiogram  12/08/2019 1. Left ventricular ejection fraction, by estimation, is 60 to 65%. The left ventricle has normal function. The left ventricle has no regional wall motion abnormalities. There is moderate left ventricular hypertrophy. Left ventricular diastolic  parameters are indeterminate.  2. Right ventricular systolic function is normal. The right ventricular size is normal.  3. Left atrial size was severely dilated.  4. The mitral valve is normal in structure. Mild mitral valve regurgitation. No evidence of mitral stenosis.  5. The aortic valve is tricuspid. There is mild calcification of the aortic valve. There is mild thickening of the aortic valve. Aortic valve regurgitation is not visualized. Mild aortic valve stenosis.  6. The inferior vena cava is dilated in size with >50% respiratory variability, suggesting right atrial pressure of 8 mmHg.   Bilateral Carotid Dopplers  12/08/2019 Right Carotid: The extracranial vessels were near-normal with only minimal wall thickening or plaque.  Left Carotid: The extracranial vessels were near-normal with only minimal wall thickening or plaque.  Vertebrals: Bilateral vertebral arteries demonstrate antegrade flow.  Subclavians: Normal flow hemodynamics were seen in bilateral subclavian arteries.   ECG - atrial fibrillation - ventricular response 105 BPM (See cardiology reading for complete details)   PHYSICAL EXAM  Temp:  [98 F (36.7 C)-98.8 F (37.1 C)] 98.2 F (36.8 C) (09/21 0809) Pulse Rate:  [31-104] 102 (09/21 0809) Resp:  [16-29] 16 (09/21 0809) BP: (124-172)/(76-118) 153/90 (09/21 0809) SpO2:  [91 %-98 %] 94 % (09/21 0809)  General - Well nourished, well developed, in no apparent distress.  Ophthalmologic - fundi not visualized due to noncooperation.  Cardiovascular - irregularly irregular heart rate and rhythm with RVR  Mental Status -  Level of arousal and orientation to time, place, and person were intact. Language including expression, naming repetition, comprehension was assessed and found intact.  Concentration and attention intact Registration 3/3, delayed recall 3/3. No apraxia. Fund of Knowledge was assessed and was intact.  Cranial Nerves II - XII - II - Visual  field intact OU. III, IV, VI - Extraocular movements intact. V - Facial sensation intact bilaterally. VII - Facial movement intact bilaterally. VIII - Hearing & vestibular intact bilaterally. X - Palate elevates symmetrically. XI - Chin turning & shoulder shrug intact bilaterally. XII - Tongue protrusion intact.  Motor Strength - The patient's strength was normal in all extremities and pronator drift was absent except old right shoulder limited ROM due to old should dislocation.  Bulk was normal and fasciculations were absent.   Motor Tone - Muscle tone was assessed at the neck and appendages and was normal.  Reflexes - The patient's reflexes were symmetrical in all extremities and she had no pathological reflexes.  Sensory - Light touch, temperature/pinprick were assessed and were symmetrical.    Coordination - The patient had normal movements in the hands with no ataxia or dysmetria.  Tremor was absent.  Gait and Station - deferred.    ASSESSMENT/PLAN Ms. ALMADELIA LOOMAN is a 79 y.o. female with history of hypertension, hyperlipidemia, and depression/anxiety presenting with  speech difficulties - noted to be in Afib in ER (new diagnosis). Admitted to ICU for monitoring (outsisde window for tPA).   Stroke: punctate and small patchy acute left MCA infarcts - embolic secondary to new diagnosed atrial fibrillation.  CT head - normal head CT  MRI head - Punctate acute infarct within the left frontal operculum, and small subacute infarct of the left periatrial white matter.   MRA head and neck - Short segment occlusion of the left MCA M2 segment.   CTA Head - There is clot in two branches of the inferior division left MCA which are nonocclusive. Probable emboli. Superior division left MCA patent. Left M1 segment patent.    CT Perfusion - Negative for core infarct. There is 31 mL of delayed perfusion on T-max greater than 4 seconds involving the left temporoparietal lobe.   MRI repeat -  new L occipital parietal cortex infarct. No change in L mid frontal cortex and L temporoparietal  White matter recent infarcts.   Carotid Doppler - B ICA 1-39% stenosis, VAs antegrade   2D Echo - EF 60-56%. LA severely dilated  [employer] Virus 2 - negative  LDL - 78  HgbA1c - 6.0  VTE prophylaxis - Eliquis   No antithrombotic prior to admission, now on Eliquis.  Continue on discharge  Therapy recommendations:  HH OT, OP PT, OP SLP, 247 supervision  Disposition:  Pending  TOC involvement for d/c planning  Anticipate d/c 9/22  Afib RVR  New diagnosis  On tele and EKG  Home metoprolol 25 bid  Heart rate 90-110  Increase metoprolol to 50 bid  On Eliquis, continue on discharge  outpt cardiology follow up will be needed  Hypertension  Home BP meds: metoprolol ; irbesartan ; hydalazine  On metoprolol 25->37.5->50 bid  BP goal  < 180 . Orthostatic vitals no orthostatic hypotension . Long-term BP goal 130-150 given left M2 high grade stenosis  Hyperlipidemia  Home Lipid lowering medication: Crestor 20 mg daily  LDL 78, goal < 70  Current lipid lowering medication: Crestor 20 mg daily  Continue statin at discharge  Other Stroke Risk Factors  Advanced age  Obesity, Body mass index is 30.9 kg/m., recommend weight loss, diet and exercise as appropriate   Other Active Problems  Code status - Full code  Recent trip with large crowd 9/8-9/16- covid vaccine in 05/2019 - current covid neg - pt request covid booster shot before discharge - pending   On Klonopin for sleep  Hospital day # 2  Marvel Plan, MD PhD Stroke Neurology 12/10/2019 10:27 AM   To contact Stroke Continuity provider, please refer to [email or web address]. After hours, contact General Neurology

## 2019-12-10 NOTE — Progress Notes (Signed)
Transitions of Care Pharmacist Note  Stephanie Rodgers is a 79 y.o. female that has been diagnosed with A Fib and will be prescribed Eliquis (apixaban) at discharge.   Patient Education: I provided the following education on Apixaban to the patient: How to take the medication Described what the medication is Signs of bleeding Signs/symptoms of VTE and stroke  Answered their questions   We spoke at length about the signs of stroke, and about her new apixaban. She was very pleasant and receptive to the education. She expressed her plans for healthy weight lost to assist in her ongoing health.   Discharge Medications Plan: The patient wants to have their discharge medications filled by the Transitions of Care pharmacy rather than their usual pharmacy.  The discharge orders pharmacy has been changed to the Transitions of Care pharmacy, the patient will receive a phone call regarding co-pay, and their medications will be delivered by the Transitions of Care pharmacy.    Thank you,   Lamar Sprinkles, PharmD PGY1 Pharmacy Resident 12/10/2019 7:04 PM  December 10, 2019

## 2019-12-10 NOTE — TOC Initial Note (Signed)
Transition of Care [facility]) - Initial/Assessment Note    Patient Details  Name: Stephanie Rodgers MRN: [id number] Date of Birth: 1941-03-05  Transition [facility] CM/SW Contact:    Pollie Friar, RN Phone Number: 12/10/2019, 2:50 PM  Clinical Narrative:                 CM met with the patients daughter. She is requesting SNF rehab prior to home. Recommendations are for [facility] or outpatient therapy. CM went over this with the daughter but she feels the patient needs rehab prior to home. She asked that she be faxed out to [facility] and have insurance started to see if she will get approved for some SNF rehab. TOC has done this.  Daughter also asking that if pt has to d/c home that she have: RN/PT/OT/ST/ aide through [facility] services. She asked for [facility] or [facility]. CM has reached out to [facility] to see what the start date would be if accepted.  Daughter requesting walker for home. CM will order DME through [employer]. TOC following.  Expected Discharge Plan: [place] Barriers to Discharge: Continued Medical Work up   Patient Goals and CMS Choice   CMS Medicare.gov Compare Post Acute Care list provided to:: Patient Represenative (must comment) Choice offered to / list presented to : Adult Children, Patient  Expected Discharge Plan and Services Expected Discharge Plan: [place]   Discharge Planning Services: CM Consult Post Acute Care Choice: [place] arrangements for the past 2 months: Single Family Home                                      Prior Living Arrangements/Services Living arrangements for the past 2 months: Single Family Home Lives with:: Spouse, Adult Children Patient language and need for interpreter reviewed:: Yes Do you feel safe going back to the place where you live?: Yes   Daughter reports concerns about pt cognative ability to be in the home  Need for Family Participation in Patient Care: Yes (Comment) Care  giver support system in place?: No (comment) (daughter states her father and brother cant provide the 24 hour supervision)   Criminal Activity/Legal Involvement Pertinent to Current Situation/Hospitalization: No - Comment as needed  Activities of Daily Living Home Assistive Devices/Equipment: Eyeglasses, Dentures (specify type) (partial lower) ADL Screening (condition at time of admission) Patient's cognitive ability adequate to safely complete daily activities?: Yes Is the patient deaf or have difficulty hearing?: No Does the patient have difficulty seeing, even when wearing glasses/contacts?: No Does the patient have difficulty concentrating, remembering, or making decisions?: No Patient able to express need for assistance with ADLs?: Yes Does the patient have difficulty dressing or bathing?: No Independently performs ADLs?: No Communication: Independent Dressing (OT): Needs assistance Is this a change from baseline?: Change from baseline, expected to last <3days Grooming: Needs assistance Is this a change from baseline?: Change from baseline, expected to last <3 days Feeding: Needs assistance Is this a change from baseline?: Change from baseline, expected to last <3 days Bathing: Needs assistance Is this a change from baseline?: Change from baseline, expected to last <3 days Toileting: Needs assistance Is this a change from baseline?: Change from baseline, expected to last <3 days In/Out Bed: Needs assistance Is this a change from baseline?: Change from baseline, expected to last <3 days Walks in Home: Needs assistance  Is this a change from baseline?: Change from baseline, expected to last <3 days Does the patient have difficulty walking or climbing stairs?: Yes Weakness of Legs: None Weakness of Arms/Hands: None  Permission Sought/Granted Permission sought to share information with : Facility Geographical information systems officer)                Emotional  Assessment Appearance:: Appears stated age Attitude/Demeanor/Rapport: Unable to Assess Affect (typically observed): Unable to Assess Orientation: : Oriented to Self, Oriented to Place, Oriented to  Time, Oriented to Situation Alcohol / Substance Use: Not Applicable Psych Involvement: No (comment)  Admission diagnosis:  Stroke (cerebrum) (HCC) [I63.9] Acute ischemic left MCA stroke (HCC) [I63.512] Cerebrovascular accident (CVA), unspecified mechanism ([place]) [I63.9] Patient Active Problem List   Diagnosis Date Noted  . Cerebral embolism with cerebral infarction 12/08/2019  . Stroke (cerebrum) ([place]) 12/08/2019  . Acute ischemic left MCA stroke ([place]) 12/08/2019   PCP:  Rolland Porter, PA-C Pharmacy:   [place], [place] - [address] [place] [address] Phone: [phone] Fax: [phone]     Social Determinants of Health (SDOH) Interventions    Readmission Risk Interventions No flowsheet data found.

## 2019-12-10 NOTE — Progress Notes (Signed)
Patient transferred from 4N, arrived in the unit at 0200 am in a wheel chair, A&O x4, no s/s distress, initiated continues telemetry monitor, and will continue to monitor.

## 2019-12-11 DIAGNOSIS — E669 Obesity, unspecified: Secondary | ICD-10-CM | POA: Diagnosis present

## 2019-12-11 DIAGNOSIS — I4891 Unspecified atrial fibrillation: Secondary | ICD-10-CM | POA: Diagnosis present

## 2019-12-11 DIAGNOSIS — I10 Essential (primary) hypertension: Secondary | ICD-10-CM | POA: Diagnosis present

## 2019-12-11 DIAGNOSIS — E785 Hyperlipidemia, unspecified: Secondary | ICD-10-CM | POA: Diagnosis present

## 2019-12-11 LAB — CBC
HCT: 39.8 % (ref 36.0–46.0)
Hemoglobin: 13.1 g/dL (ref 12.0–15.0)
MCH: 29.9 pg (ref 26.0–34.0)
MCHC: 32.9 g/dL (ref 30.0–36.0)
MCV: 90.9 fL (ref 80.0–100.0)
Platelets: 214 10*3/uL (ref 150–400)
RBC: 4.38 MIL/uL (ref 3.87–5.11)
RDW: 14.9 % (ref 11.5–15.5)
WBC: 6.9 10*3/uL (ref 4.0–10.5)
nRBC: 0 % (ref 0.0–0.2)

## 2019-12-11 LAB — BASIC METABOLIC PANEL
Anion gap: 12 (ref 5–15)
BUN: 15 mg/dL (ref 8–23)
CO2: 19 mmol/L — ABNORMAL LOW (ref 22–32)
Calcium: 8.7 mg/dL — ABNORMAL LOW (ref 8.9–10.3)
Chloride: 107 mmol/L (ref 98–111)
Creatinine, Ser: 0.67 mg/dL (ref 0.44–1.00)
GFR calc Af Amer: >60 mL/min (ref >60)
GFR calc non Af Amer: >60 mL/min (ref >60)
Glucose, Bld: 105 mg/dL — ABNORMAL HIGH (ref 70–99)
Potassium: 6.2 mmol/L — ABNORMAL HIGH (ref 3.5–5.1)
Sodium: 138 mmol/L (ref 135–145)

## 2019-12-11 MED ORDER — APIXABAN 5 MG PO TABS: 5.0000 mg | ORAL_TABLET | Freq: Two times a day (BID) | ORAL | 2 refills | Status: AC

## 2019-12-11 MED ORDER — METOPROLOL TARTRATE 50 MG PO TABS: 50.0000 mg | ORAL_TABLET | Freq: Two times a day (BID) | ORAL | 2 refills | Status: DC

## 2019-12-11 MED FILL — ELIQUIS 5 MG TABLET: 5 | 30 days supply | Qty: 60 | Fill #0

## 2019-12-11 NOTE — Plan of Care (Signed)
Pt alert and oriented. Tele in place. NIH 0. HA controlled with PRN med.   Problem: Education: Goal: Knowledge of disease or condition will improve Outcome: Progressing Goal: Knowledge of secondary prevention will improve Outcome: Progressing Goal: Individualized Educational Video(s) Outcome: Progressing   Problem: Ischemic Stroke/TIA Tissue Perfusion: Goal: Complications of ischemic stroke/TIA will be minimized Outcome: Progressing

## 2019-12-11 NOTE — Discharge Summary (Addendum)
Stroke Discharge Summary  Patient ID: Stephanie Rodgers    l   MRN: [id number]      DOB: 07/06/1940  Date of Admission: 12/08/2019 Date of Discharge: 12/11/2019  Attending Physician:  Marvel PlanXu, Makael Stein, MD, Stroke MD Consultant(s):    None  Patient's PCP:  Charolett Bumpersoyle, Patricia K, PA-C  DISCHARGE DIAGNOSIS:  Principal Problem:   Cerebral embolism with cerebral infarction (HCC) L MCA d/t new onset AF Active Problems:   Atrial fibrillation with RVR (HCC)   Essential hypertension   Hyperlipidemia LDL goal <70   Obesity   Allergies as of 12/11/2019      Reactions   Alendronate Other (See Comments)   Edema, joint pain Edema, joint pain   Ciprofloxacin Other (See Comments)   Abdominal pain, fatigue Abdominal pain, fatigue   Hydrochlorothiazide Other (See Comments)   gout gout   Penicillins Other (See Comments), Rash   Welts Welts Welts Welts   Colesevelam Other (See Comments)   unknown      Medication List    STOP taking these medications   hydrALAZINE 10 MG tablet Commonly known as: APRESOLINE   irbesartan 300 MG tablet Commonly known as: AVAPRO     TAKE these medications   apixaban 5 MG Tabs tablet Commonly known as: ELIQUIS Take 1 tablet (5 mg total) by mouth 2 (two) times daily.   B & L Sensitive Eyes Saline 0.4 % Soln Place 1 drop into both eyes daily as needed (dry eyes).   B-12 PO Take 1 capsule by mouth daily.   clonazePAM 0.5 MG tablet Commonly known as: KLONOPIN Take 0.5 mg by mouth daily as needed (sleep).   loratadine 10 MG tablet Commonly known as: CLARITIN Take 10 mg by mouth daily as needed for allergies.   metoprolol tartrate 50 MG tablet Commonly known as: LOPRESSOR Take 1 tablet (50 mg total) by mouth 2 (two) times daily. What changed:   medication strength  how much to take   rosuvastatin 20 MG tablet Commonly known as: CRESTOR Take 20 mg by mouth daily.   VITAMIN D3 PO Take 1 tablet by mouth daily.            Durable Medical  Equipment  (From admission, onward)         Start     Ordered   12/11/19 1028  For home use only DME 3 n 1  Once        12/11/19 1027   12/10/19 1456  For home use only DME Walker rolling  Once       Question Answer Comment  Walker: With 5 Inch Wheels   Patient needs a walker to treat with the following condition Stroke (HCC)      12/10/19 1455          LABORATORY STUDIES CBC    Component Value Date/Time   WBC 6.9 12/11/2019 0415   RBC 4.38 12/11/2019 0415   HGB 13.1 12/11/2019 0415   HCT 39.8 12/11/2019 0415   PLT 214 12/11/2019 0415   MCV 90.9 12/11/2019 0415   MCH 29.9 12/11/2019 0415   MCHC 32.9 12/11/2019 0415   RDW 14.9 12/11/2019 0415   LYMPHSABS 1.6 12/08/2019 0112   MONOABS 0.5 12/08/2019 0112   EOSABS 0.1 12/08/2019 0112   BASOSABS 0.1 12/08/2019 0112   CMP    Component Value Date/Time   NA 138 12/11/2019 0415   K 6.2 (H) 12/11/2019 0415   CL 107 12/11/2019 0415   CO2 19 (  L) 12/11/2019 0415   GLUCOSE 105 (H) 12/11/2019 0415   BUN 15 12/11/2019 0415   CREATININE 0.67 12/11/2019 0415   CALCIUM 8.7 (L) 12/11/2019 0415   PROT 7.0 12/08/2019 0112   ALBUMIN 3.8 12/08/2019 0112   AST 24 12/08/2019 0112   ALT 17 12/08/2019 0112   ALKPHOS 77 12/08/2019 0112   BILITOT 0.5 12/08/2019 0112   GFRNONAA >60 12/11/2019 0415   GFRAA >60 12/11/2019 0415   COAGS Lab Results  Component Value Date   INR 1.1 12/08/2019   Lipid Panel    Component Value Date/Time   CHOL 138 12/09/2019 0323   TRIG 105 12/09/2019 0323   HDL 39 (L) 12/09/2019 0323   CHOLHDL 3.5 12/09/2019 0323   VLDL 21 12/09/2019 0323   LDLCALC 78 12/09/2019 0323   HgbA1C  Lab Results  Component Value Date   HGBA1C 6.0 (H) 12/09/2019   Alcohol Level    Component Value Date/Time   ETH <10 12/08/2019 0111     SIGNIFICANT DIAGNOSTIC STUDIES DG Chest 2 View 12/08/2019 IMPRESSION:  1. Increased size of the cardiomediastinal silhouette since 2016. Pericardial effusion is on the  differential. Recommend correlation with echocardiography.  2. Diffuse interstitial prominence is favored to reflect underlying pulmonary edema. Additional differential considerations include atypical infection versus pulmonary fibrosis.   CT HEAD WO CONTRAST 12/08/2019 IMPRESSION:  Normal head CT.   MR ANGIO HEAD WO CONTRAST 12/08/2019 IMPRESSION:  Short segment occlusion of the left MCA M2 segment.   MR ANGIO NECK WO CONTRAST 12/08/2019 IMPRESSION:  Normal MRA of the neck.   MR BRAIN WO CONTRAST 12/08/2019  IMPRESSION:  1. Punctate acute infarct within the left frontal operculum, which could contribute to aphasia.  2. Possible small subacute infarct of the left periatrial white matter.   CT ANGIO HEAD CODE STROKE 12/08/2019 IMPRESSION:  There is clot in two branches of the inferior division left MCA which are nonocclusive. Probable emboli. Superior division left MCA patent. Left M1 segment patent. No other intracranial stenosis or large vessel occlusion   CT CEREBRAL PERFUSION W CONTRAST 12/08/2019 IMPRESSION:  Negative for core infarct. There is 31 mL of delayed perfusion on T-max greater than 4 seconds involving the left temporoparietal lobe. There is clot in the inferior division of the left MCA on CTA.   MR BRAIN WO CONTRAST 12/08/2019  IMPRESSION:  New area of acute infarct in the left occipital parietal cortex which was not present earlier today. No change in small area of restricted diffusion in the left mid frontal cortex and in the left temporoparietal white matter compatible with recent infarct.   DG Chest Portable 1 View 12/08/2019 IMPRESSION: Possible interval development of mild cardiomegaly. A standard two view chest radiograph would be helpful in confirming true enlargement since prior exam.   Transthoracic Echocardiogram  12/08/2019 1. Left ventricular ejection fraction, by estimation, is 60 to 65%. The left ventricle has normal function. The left  ventricle has no regional wall motion abnormalities. There is moderate left ventricular hypertrophy. Left ventricular diastolic parameters are indeterminate.  2. Right ventricular systolic function is normal. The right ventricular size is normal.  3. Left atrial size was severely dilated.  4. The mitral valve is normal in structure. Mild mitral valve regurgitation. No evidence of mitral stenosis.  5. The aortic valve is tricuspid. There is mild calcification of the aortic valve. There is mild thickening of the aortic valve. Aortic valve regurgitation is not visualized. Mild aortic valve stenosis.  6. The  inferior vena cava is dilated in size with >50% respiratory variability, suggesting right atrial pressure of 8 mmHg.   Bilateral Carotid Dopplers  12/08/2019 Right Carotid: The extracranial vessels were near-normal with only minimal wall thickening or plaque.  Left Carotid: The extracranial vessels were near-normal with only minimal wall thickening or plaque.  Vertebrals: Bilateral vertebral arteries demonstrate antegrade flow.  Subclavians: Normal flow hemodynamics were seen in bilateral subclavian arteries.   ECG - atrial fibrillation - ventricular response 105 BPM (See cardiology reading for complete details)     HISTORY OF PRESENT ILLNESS  Stephanie Rodgers is a 79 y.o. female with past medical history significant for hypertension, hyperlipidemia, depression/anxiety presents to [facility] emergency department for aphasia and slurred speech. Last known normal on 9/18/201 at 10 PM, however it is initially stated as several days ago before her trip.  According to EMS patient went on a trip for several days and when she came back home, she was talking different.  Code stroke not activated as it was assumed that stroke occurred several days ago. However on speaking with the daughter at bedside, her speech suddenly changed around 10 PM last night when she had slurred speech and word salad.  She  has been feeling sick since her trip with nausea, dizziness.  On arrival to [facility] , it was noted by nurse that she is having waxing and waning aphasia.  Stat CT head was obtained which was unremarkable.  Patient was taken for MRI brain which showed a punctate infarct in the left MCA territory as well as a subacute left frontal white matter territory infarct.  While patient was in the MRI scanner-we were made aware the change occurred at 10 PM instead of several days ago.  MRI was performed which showed a proximal left M2 occlusion.  On assessment after she returned from MRI, NIH stroke scale was 3 for mild expressive aphasia, sensory and visual neglect.  Patient also noted to be in atrial fibrillation in the emergency department, currently in rate control. tPA was not given as outside the TPA window. NIHSS: 3. Baseline MRS 0.   HOSPITAL COURSE Stephanie Rodgers is a 79 y.o. female with history of hypertension, hyperlipidemia, and depression/anxiety presenting with speech difficulties - noted to be in Afib in ER (new diagnosis). Admitted to ICU for monitoring (outsisde window for tPA).   Stroke: punctate and small patchy acute left MCA infarcts - embolic secondary to new diagnosed atrial fibrillation.  CT head - normal head CT  MRI head - Punctate acute infarct within the left frontal operculum, and small subacute infarct of the left periatrial white matter.   MRA head and neck - Short segment occlusion of the left MCA M2 segment.   CTA Head - There is clot in two branches of the inferior division left MCA which are nonocclusive. Probable emboli. Superior division left MCA patent. Left M1 segment patent.    CT Perfusion - Negative for core infarct. There is 31 mL of delayed perfusion on T-max greater than 4 seconds involving the left temporoparietal lobe.   MRI repeat - new L occipital parietal cortex infarct. No change in L mid frontal cortex and L temporoparietal  White matter recent  infarcts.   Carotid Doppler - B ICA 1-39% stenosis, VAs antegrade   2D Echo - EF 60-56%. LA severely dilated  [employer] Virus 2 - negative  LDL - 78  HgbA1c - 6.0  No antithrombotic prior to admission, now  on Eliquis.  Continue on discharge  Therapy recommendations:  HH OT, OP PT, OP SLP, 247 supervision   Disposition:  return home w/ HH   Afib RVR  New diagnosis  On tele and EKG  Home metoprolol 25 bid  Heart rate 90-110  Increase metoprolol to 50 bid  On Eliquis, continue on discharge  outpt cardiology follow up has setup  Hypertension  Home BP meds: metoprolol ; irbesartan ; hydalazine  On metoprolol 25->37.5->50 bid  BP goal  < 180  Orthostatic vitals no orthostatic hypotension  Long-term BP goal 130-150 given left M2 high grade stenosis  Hyperlipidemia  Home Lipid lowering medication: Crestor 20 mg daily  LDL 78, goal < 70  Current lipid lowering medication: Crestor 20 mg daily  Continue statin at discharge  Other Stroke Risk Factors  Advanced age  Obesity, Body mass index is 30.9 kg/m., recommend weight loss, diet and exercise as appropriate   Other Active Problems  Recent trip with large crowd 9/8-9/16- covid vaccine in 05/2019 - current covid neg   On Klonopin for sleep   DISCHARGE EXAM Blood pressure (!) 167/108, pulse 89, temperature 97.7 F (36.5 C), temperature source Oral, resp. rate 18, height 5\' 4"  (1.626 m), weight 81.6 kg, SpO2 95 %. General - Well nourished, well developed, in no apparent distress.  Ophthalmologic - fundi not visualized due to noncooperation.  Cardiovascular - irregularly irregular heart rate and rhythm with intermittent RVR  Mental Status -  Level of arousal and orientation to time, place, and person were intact. Language including expression, naming repetition, comprehension was assessed and found intact.  Concentration and attention intact Registration 3/3, delayed recall 3/3. No  apraxia. Fund of Knowledge was assessed and was intact.  Cranial Nerves II - XII - II - Visual field intact OU. III, IV, VI - Extraocular movements intact. V - Facial sensation intact bilaterally. VII - Facial movement intact bilaterally. VIII - Hearing & vestibular intact bilaterally. X - Palate elevates symmetrically. XI - Chin turning & shoulder shrug intact bilaterally. XII - Tongue protrusion intact.  Motor Strength - The patient's strength was normal in all extremities and pronator drift was absent except old right shoulder limited ROM due to old should dislocation.  Bulk was normal and fasciculations were absent.   Motor Tone - Muscle tone was assessed at the neck and appendages and was normal.  Reflexes - The patient's reflexes were symmetrical in all extremities and she had no pathological reflexes.  Sensory - Light touch, temperature/pinprick were assessed and were symmetrical.    Coordination - The patient had normal movements in the hands with no ataxia or dysmetria.  Tremor was absent.  Gait and Station - deferred.  Discharge Diet   Heart healthy / carb modified, thin liquids  DISCHARGE PLAN  Disposition:  Return home  Home health PT, OT, SLP and equipment  Eliquis (apixaban) daily for secondary stroke prevention   Ongoing stroke risk factor control by Primary Care Physician at time of discharge  Follow-up PCP - Dr. 01/15/2020 at 1530 - on call list to get in earlier  Follow-up cardiology 01/17/2020 01/13/2020 at 1330  Follow-up in [facility] in 4 weeks, office to schedule an appointment.   35 minutes were spent preparing discharge.  01/15/2020, MD PhD Stroke Neurology 12/11/2019 6:53 PM

## 2019-12-11 NOTE — TOC Transition Note (Signed)
Transition of Care [facility]) - CM/SW Discharge Note   Patient Details  Name: Stephanie Rodgers MRN: [id number] Date of Birth: [birth year]  Transition of Care [facility]) CM/SW Contact:  Kermit Balo, RN Phone Number: 12/11/2019, 2:22 PM   Clinical Narrative:    Pt discharging home with [facility] services through [facility]. They plan on seeing the patient tomorrow.  Pt with orders for 3 in 1. DME delivered to the room per [employer]. Pts daughter states she has a walker at home. D/c meds to be delivered to the room per [facility] pharmacy.  Daughter to provide transport home.   Final next level of care: Home w Home Health Services Barriers to Discharge: No Barriers Identified   Patient Goals and CMS Choice   CMS Medicare.gov Compare Post Acute Care list provided to:: Patient Represenative (must comment) Choice offered to / list presented to : Adult Children  Discharge Placement                       Discharge Plan and Services   Discharge Planning Services: CM Consult Post Acute Care Choice: Home Health          DME Arranged: 3-N-1 DME Agency: [employer] Date DME Agency Contacted: 12/11/19   Representative spoke with at DME Agency: Velna Hatchet HH Arranged: RN, PT, OT, Nurse's Aide, Speech Therapy HH Agency: [facility] ([facility] Date [facility] Agency Contacted: 12/10/19   Representative spoke with at [facility] Agency: Rosey Bath  Social Determinants of Health (SDOH) Interventions     Readmission Risk Interventions No flowsheet data found.

## 2019-12-11 NOTE — Plan of Care (Signed)
  Problem: Education: Goal: Knowledge of disease or condition will improve Outcome: Adequate for Discharge Goal: Knowledge of secondary prevention will improve Outcome: Adequate for Discharge Goal: Individualized Educational Video(s) Outcome: Adequate for Discharge   Problem: Ischemic Stroke/TIA Tissue Perfusion: Goal: Complications of ischemic stroke/TIA will be minimized Outcome: Adequate for Discharge

## 2019-12-11 NOTE — Progress Notes (Signed)
Physical Therapy Treatment Patient Details Name: Stephanie Rodgers MRN: [id number] DOB: [birth year] Today's Date: 12/11/2019    History of Present Illness Pt is a 79 y.o. female with PMH of HTN, HLD, depression/anxiety, who presents 12/08/19 with aphasia and slurred speech, as well as new Afib. MRI brain showed a punctate infarct in the left MCA territory; subacute left frontal white matter territory infarct.   PT Comments    Pt progressing well with mobility. Today's session focused on additional gait training; pt demonstrates improved cognition and balance this session. Pt with improved insight into deficits. Moving well at supervision-level. Educ re: fall risk reduction, safety and activity recommendations. Pt planning to d/c home today. Recommend follow-up with HHPT services to maximize functional mobility and independence.    Follow Up Recommendations  Home health PT;Supervision - Intermittent     Equipment Recommendations  3in1 (PT) (already delivered)    Recommendations for Other Services       Precautions / Restrictions Precautions Precautions: Fall Precaution Comments: H/o R shoulder dislocation (2019) presenting similar to rotator cuff tear Restrictions Weight Bearing Restrictions: No    Mobility  Bed Mobility Overal bed mobility: Modified Independent Bed Mobility: Supine to Sit     Supine to sit: Modified independent (Device/Increase time);HOB elevated        Transfers Overall transfer level: Needs assistance Equipment used: None Transfers: Sit to/from Stand Sit to Stand: Supervision         General transfer comment: Supervision for safety standing from EOB and low toilet height  Ambulation/Gait Ambulation/Gait assistance: Supervision Gait Distance (Feet): 250 Feet Assistive device: None Gait Pattern/deviations: Step-through pattern;Decreased stride length   Gait velocity interpretation: 1.31 - 2.62 ft/sec, indicative of limited community  ambulator General Gait Details: Fast, mostly steady gait without DME, no overt instability or LOB; pt declined gait trial with RW; HR up to 147 walking   Stairs Stairs: Yes Stairs assistance: Supervision Stair Management: One rail Left;Alternating pattern;Forwards Number of Stairs: 5 General stair comments: Supervision for safety; no overt instability or LOB   Wheelchair Mobility    Modified Rankin (Stroke Patients Only) Modified Rankin (Stroke Patients Only) Pre-Morbid Rankin Score: No symptoms Modified Rankin: Moderate disability     Balance Overall balance assessment: Needs assistance Sitting-balance support: Feet supported;No upper extremity supported Sitting balance-Leahy Scale: Good Sitting balance - Comments: Indep to perform pericare sitting on toilet   Standing balance support: No upper extremity supported;During functional activity Standing balance-Leahy Scale: Good               High level balance activites: Side stepping;Direction changes;Turns;Sudden stops;Head turns High Level Balance Comments: No overt LOB with higher level balance tasks            Cognition Arousal/Alertness: Awake/alert Behavior During Therapy: WFL for tasks assessed/performed Overall Cognitive Status: Impaired/Different from baseline Area of Impairment: Attention                   Current Attention Level: Selective           General Comments: No inattention noted this session; pt verbalizes and demonstrates good awareness of balance deficits      Exercises      General Comments General comments (skin integrity, edema, etc.): Educ on return home, fall risk reduction, activity recommendations      Pertinent Vitals/Pain Pain Assessment: No/denies pain    Home Living  Prior Function            PT Goals (current goals can now be found in the care plan section) Progress towards PT goals: Progressing toward goals     Frequency    Min 4X/week      PT Plan Current plan remains appropriate    Co-evaluation              AM-PAC PT "6 Clicks" Mobility   Outcome Measure  Help needed turning from your back to your side while in a flat bed without using bedrails?: None Help needed moving from lying on your back to sitting on the side of a flat bed without using bedrails?: None Help needed moving to and from a bed to a chair (including a wheelchair)?: None Help needed standing up from a chair using your arms (e.g., wheelchair or bedside chair)?: None Help needed to walk in hospital room?: None Help needed climbing 3-5 steps with a railing? : None 6 Click Score: 24    End of Session   Activity Tolerance: Patient tolerated treatment well Patient left: in bed;with call bell/phone within reach Nurse Communication: Mobility status PT Visit Diagnosis: Other abnormalities of gait and mobility (R26.89);Other symptoms and signs involving the nervous system (R29.898)     Time: 1497-0263 PT Time Calculation (min) (ACUTE ONLY): 13 min  Charges:  $Gait Training: 8-22 mins                    Ina Homes, PT, DPT Acute Rehabilitation Services  Pager [phone] Office [phone]  Stephanie Rodgers 12/11/2019, 4:33 PM

## 2019-12-11 NOTE — Progress Notes (Signed)
  Speech Language Pathology Treatment: Cognitive-Linquistic  Patient Details Name: Stephanie Rodgers MRN: [id number] DOB: August 31, 1940 Today's Date: 12/11/2019 Time: 1445-1500 SLP Time Calculation (min) (ACUTE ONLY): 15 min  Assessment / Plan / Recommendation Clinical Impression  Pt demonstrated improvements in cognitive-linguistic function. She demonstrated improved clock-drawing with appropriate planning and spacing of numbers and correct hand placement.  Verbal expression was more focused with improved topic maintenance. Pt followed multi-step instructions, waiting for directions, asking questions when necessary, and executing without impulsive response pattern.  She was able to sequence and write out steps for cooking task with attention to form/sentence structure and appropriate order of events.  She recognized errors and self-corrected without cues.  We discussed improvements as well as plan for Stephanie Rodgers SLP f/u, and the importance of therapy being relevant and meaningful.  Pt and daughter, at bedside, agreed with progress and plan for f/u. Pt is D/Cing home today.   HPI HPI: Stephanie Rodgers is a 79 y.o. female with past medical history significant for hypertension, hyperlipidemia, depression/anxiety presents to [facility] emergency department for aphasia and slurred speech. Patient was taken for MRI brain which showed a punctate infarct in the left frontal operculum as well as a subacute left frontal white matter territory infarct. Repeat MRI showed new area of acute infarct in the left occipital parietal cortex. NIH stroke scale was 3 for mild expressive aphasia, sensory and visual neglect.      SLP Plan  All goals met       Recommendations                   Follow up Recommendations: Home health SLP SLP Visit Diagnosis: Cognitive communication deficit (O48.592) Plan: All goals met       GO                Juan Quam Laurice 12/11/2019, 3:07 PM  Estill Bamberg L. Tivis Ringer, MA  CCC/SLP Clinical Specialist in [place] Office number [phone] Pager [phone]

## 2019-12-11 NOTE — TOC Benefit Eligibility Note (Signed)
Transition of Care [facility]) Benefit Eligibility Note    Patient Details  Name: Stephanie Rodgers MRN: [id number] Date of Birth: Jan 29, 1941   Medication/Dose: Eliquis 5mg  bid  Covered?: Yes  Tier: 3 Drug  Prescription Coverage Preferred Pharmacy: [place]  Spoke with Person/Company/Phone Number:: Pete P. W/[facility] [phone]  Co-Pay: $45.00  Prior Approval: No  Deductible:  (No Deductible)       VO#[phone] Phone Number: 12/11/2019, 11:59 AM

## 2020-01-08 ENCOUNTER — Encounter: Payer: Self-pay | Admitting: Adult Health

## 2020-01-08 ENCOUNTER — Other Ambulatory Visit: Payer: Self-pay

## 2020-01-08 ENCOUNTER — Ambulatory Visit (INDEPENDENT_AMBULATORY_CARE_PROVIDER_SITE_OTHER): Payer: Medicare HMO | Admitting: Adult Health

## 2020-01-08 VITALS — BP 160/102 | HR 128 | Ht 64.0 in | Wt 218.0 lb

## 2020-01-08 DIAGNOSIS — I4891 Unspecified atrial fibrillation: Secondary | ICD-10-CM

## 2020-01-08 DIAGNOSIS — E785 Hyperlipidemia, unspecified: Secondary | ICD-10-CM

## 2020-01-08 DIAGNOSIS — I10 Essential (primary) hypertension: Secondary | ICD-10-CM | POA: Diagnosis not present

## 2020-01-08 DIAGNOSIS — Z9189 Other specified personal risk factors, not elsewhere classified: Secondary | ICD-10-CM

## 2020-01-08 DIAGNOSIS — I63412 Cerebral infarction due to embolism of left middle cerebral artery: Secondary | ICD-10-CM | POA: Diagnosis not present

## 2020-01-08 NOTE — Progress Notes (Signed)
[facility] [address] [place] [id number] [phone]       HOSPITAL FOLLOW UP NOTE  Ms. Stephanie Rodgers Date of Birth:  09/19/1940 Medical Record Number:  [id number]   Reason for Referral:  hospital stroke follow up    SUBJECTIVE:   CHIEF COMPLAINT:  Chief Complaint  Patient presents with  . Hospitalization Follow-up    rm 9  . Cerebrovascular Accident    pt said she has no new stroke sx.    HPI:   Ms.Stephanie D Reaganis a 79 y.o.femalewith history of hypertension, hyperlipidemia, and depression/anxietywho presented on 12/08/2019 with speech difficulties - noted to be in Afib in ER (new diagnosis).  Personally reviewed hospitalization pertinent progress notes, lab work and imaging with summary provided.  Evaluated by Dr. Roda ShuttersXu with stroke work-up revealing punctate and small patchy acute left MCA infarcts, embolic secondary to newly diagnosed atrial fibrillation.  Repeat MRI showed new left occipital parietal cortex infarct.  Initiated Eliquis for new diagnosis of A. fib and secondary stroke prevention.  HTN stable and recommended long-term BP goal 130-150 given left M2 high-grade stenosis.  LDL 78 and continued Crestor 20 mg daily.  Other stroke risk factors include advanced age and obesity but no prior stroke history.  Evaluated by therapy and recommended discharge home with home health PT/OT/SLP and 24/7 supervision.  Stroke: punctate and small patchy acute left MCA infarcts - embolic secondary to new diagnosed atrial fibrillation.  CT head - normal head CT  MRI head - Punctate acute infarct within the left frontal operculum, and small subacute infarct of the left periatrial white matter.   MRA head and neck - Short segment occlusion of the left MCA M2 segment.   CTA Head - There is clot in two branches of the inferior division left MCA which are nonocclusive.Probable emboli. Superior division left MCA patent. Left M1 segment patent.   CT  Perfusion - Negative for core infarct. There is 31 mL of delayed perfusion on T-max greater than 4 seconds involving the left temporoparietal lobe.   MRI repeat -new L occipital parietal cortex infarct. No change in L mid frontal cortex and L temporoparietal White matter recent infarcts.  Carotid Doppler-B ICA 1-39% stenosis, VAs antegrade  2D Echo - EF 60-56%. LA severely dilated  [employer] Corona Virus 2 - negative  LDL -78  HgbA1c-6.0  No antithromboticprior to admission,now onEliquis. Continue on discharge  Therapy recommendations:HHOT, OP PT, OP SLP, 247 supervision   Disposition: return home w/ HH   Today, 01/08/2020, Ms. Stephanie Rodgers is being seen for hospital follow-up accompanied by her son.  She reports she has recovered well from a stroke standpoint with occasional mild imbalance and [facility] with continued improvement.  Denies new stroke/TIA symptoms.  She does report excessive daytime fatigue and decreased activity tolerance and has been told this is likely due to her atrial fibrillation.  She continues to follow with cardiology and currently awaiting results of her 48-hour cardiac monitoring.  She has remained on Eliquis without bleeding or bruising.  Remains on Crestor 20 mg daily without myalgias.  Blood pressure today elevated but routinely monitors at home and typically ranges 130-150s/80s-90s.  She does report chronic history of insomnia and currently on clonazepam per PCP but denies prior daytime fatigue, snoring, headaches or witnessed apneas.  She has not previously underwent evaluation for potential sleep apnea.  No further concerns at this time.    ROS:   14 system  review of systems performed and negative with exception of fatigue, imbalance, insomnia  PMH:  Past Medical History:  Diagnosis Date  . Depression   . Hyperlipidemia   . Hypertension     PSH: No past surgical history on file.  Social History:  Social History    Socioeconomic History  . Marital status: Married    Spouse name: Not on file  . Number of children: Not on file  . Years of education: Not on file  . Highest education level: Not on file  Occupational History  . Not on file  Tobacco Use  . Smoking status: Never Smoker  . Smokeless tobacco: Never Used  Substance and Sexual Activity  . Alcohol use: Not Currently  . Drug use: Not on file  . Sexual activity: Not on file  Other Topics Concern  . Not on file  Social History Narrative  . Not on file   Social Determinants of Health   Financial Resource Strain:   . Difficulty of Paying Living Expenses: Not on file  Food Insecurity:   . Worried About Programme researcher, broadcasting/film/video in the Last Year: Not on file  . Ran Out of Food in the Last Year: Not on file  Transportation Needs:   . Lack of Transportation (Medical): Not on file  . Lack of Transportation (Non-Medical): Not on file  Physical Activity:   . Days of Exercise per Week: Not on file  . Minutes of Exercise per Session: Not on file  Stress:   . Feeling of Stress : Not on file  Social Connections:   . Frequency of Communication with Friends and Family: Not on file  . Frequency of Social Gatherings with Friends and Family: Not on file  . Attends Religious Services: Not on file  . Active Member of Clubs or Organizations: Not on file  . Attends Banker Meetings: Not on file  . Marital Status: Not on file  Intimate Partner Violence:   . Fear of Current or Ex-Partner: Not on file  . Emotionally Abused: Not on file  . Physically Abused: Not on file  . Sexually Abused: Not on file    Family History: No family history on file.  Medications:   Current Outpatient Medications on File Prior to Visit  Medication Sig Dispense Refill  . apixaban (ELIQUIS) 5 MG TABS tablet Take 1 tablet (5 mg total) by mouth 2 (two) times daily. 60 tablet 2  . Cholecalciferol (VITAMIN D3 PO) Take 1 tablet by mouth daily.    . clonazePAM  (KLONOPIN) 0.5 MG tablet Take 0.5 mg by mouth daily as needed (sleep).     . Cyanocobalamin (B-12 PO) Take 1 capsule by mouth daily.    . flecainide (TAMBOCOR) 50 MG tablet Take 50 mg by mouth 2 (two) times daily.    Marland Kitchen loratadine (CLARITIN) 10 MG tablet Take 10 mg by mouth daily as needed for allergies.    . metoprolol tartrate (LOPRESSOR) 50 MG tablet Take 1 tablet (50 mg total) by mouth 2 (two) times daily. 60 tablet 2  . rosuvastatin (CRESTOR) 20 MG tablet Take 20 mg by mouth daily.    . Soft Lens Products (B & L SENSITIVE EYES SALINE) 0.4 % SOLN Place 1 drop into both eyes daily as needed (dry eyes).     No current facility-administered medications on file prior to visit.    Allergies:   Allergies  Allergen Reactions  . Alendronate Other (See Comments)    Edema,  joint pain Edema, joint pain   . Ciprofloxacin Other (See Comments)    Abdominal pain, fatigue Abdominal pain, fatigue   . Hydrochlorothiazide Other (See Comments)    gout gout   . Penicillins Other (See Comments) and Rash    Welts Welts Welts Welts   . Colesevelam Other (See Comments)    unknown      OBJECTIVE:  Physical Exam  Vitals:   01/08/20 1511  BP: (!) 160/102  Pulse: (!) 128  Weight: 218 lb (98.9 kg)  Height: 5\' 4"  (1.626 m)   Body mass index is 37.42 kg/m. No exam data present  General: well developed, well nourished,  very pleasant elderly Caucasian female, seated, in no evident distress Head: head normocephalic and atraumatic.   Neck: supple with no carotid or supraclavicular bruits Cardiovascular: Tachycardic irregular rate and rhythm, no murmurs Musculoskeletal: no deformity; limited bilateral shoulder ROM 2/2 pain vs ?  Arthritis Skin:  no rash/petichiae Vascular:  Normal pulses all extremities   Neurologic Exam Mental Status: Awake and fully alert.   Fluent speech and language.  Oriented to place and time. Recent and remote memory intact. Attention span, concentration and fund  of knowledge appropriate. Mood and affect appropriate.  Cranial Nerves: Fundoscopic exam reveals sharp disc margins. Pupils equal, briskly reactive to light. Extraocular movements full without nystagmus. Visual fields full to confrontation. Hearing intact. Facial sensation intact. Face, tongue, palate moves normally and symmetrically.  Motor: Normal bulk and tone. Normal strength in all tested extremity muscles. Sensory.: intact to touch , pinprick , position and vibratory sensation.  Coordination: Rapid alternating movements normal in all extremities. Finger-to-nose and heel-to-shin performed accurately bilaterally. Gait and Station: Arises from chair without difficulty. Stance is normal. Gait demonstrates normal stride length and balance without use of assistive device.  Able to tandem walk and heel toe with mild difficulty. Reflexes: 1+ and symmetric. Toes downgoing.     NIHSS  0 Modified Rankin  2     ASSESSMENT: YARITZEL STANGE is a 79 y.o. year old female presented with speech difficulties on 12/08/2019 with stroke work-up revealing punctate and small patchy acute left MCA and PCA infarcts, embolic secondary to new diagnosis of atrial fibrillation. Vascular risk factors include persistent atrial fibrillation, HTN, and HLD.      PLAN:  1. L MCA and PCA stroke, embolic:  a. Residual deficit: Occasional imbalance but otherwise greatly improved.  Continue working with [facility] PT for continued improvement.  She will likely not need additional therapy outpatient once completed.   b. Continue Eliquis (apixaban) daily  and Crestor for secondary stroke prevention.  c. Discussed secondary stroke prevention measures and importance of close PCP follow up for aggressive stroke risk factor management  2. Persistent atrial fibrillation, new dx:  a. CHA2DS2-VASc score 5 -Eliquis initiated during recent admission and advised continuation for secondary stroke prevention b. Continue to follow with  cardiology for ongoing Eliquis prescribing and routine monitoring c. Question symptomatic currently being evaluated by cardiology to possibly undergo cardioversion vs ablation 3. At risk for sleep apnea:  a. Discussed increased fatigue and lack of energy could be due to underlying sleep apnea as well as chronic history of insomnia.   b. Discussed increased risk of sleep apnea with new diagnosis of atrial fibrillation and recent stroke.   c. Referral placed to [facility] sleep clinic for further evaluation 4. HTN: BP goal 130-150/90 to ensure adequate perfusion.  Elevated today but stable at home per patient. On metoprolol per  PCP 5. HLD: LDL goal <70. Recent LDL 78.  Continued home dose Crestor 20 mg daily per PCP     Follow up in 3 months or call earlier if needed   I spent 45 minutes of face-to-face and non-face-to-face time with patient and son.  This included previsit chart review, lab review, study review, order entry, electronic health record documentation, patient education regarding recent stroke and etiology, discussed new diagnosis of atrial fibrillation, residual stroke deficits, importance of managing stroke risk factors and answered all questions to patient satisfaction   Ihor Austin, [facility]  [facility] [address] [place], [place] [address]  Phone [phone] Fax [phone] Note: This document was prepared with digital dictation and possible smart phrase technology. Any transcriptional errors that result from this process are unintentional.

## 2020-01-08 NOTE — Patient Instructions (Addendum)
Continue Eliquis (apixaban) daily  and Crestor  for secondary stroke prevention  Referral placed to [facility] sleep clinic for further evaluation of sleep apnea  Continue to follow up with PCP regarding cholesterol and blood pressure management  Maintain strict control of hypertension with blood pressure goal below 130/90 and cholesterol with LDL cholesterol (bad cholesterol) goal below 70 mg/dL.      Followup in the future with me in 3 months or call earlier if needed     Thank you for coming to see [place] at [facility]. I hope we have been able to provide you high quality care today.  You may receive a patient satisfaction survey over the next few weeks. We would appreciate your feedback and comments so that we may continue to improve ourselves and the health of our patients.     Atrial Fibrillation  Atrial fibrillation is a type of irregular or rapid heartbeat (arrhythmia). In atrial fibrillation, the top part of the heart (atria) beats in an irregular pattern. This makes the heart unable to pump blood normally and effectively. The goal of treatment is to prevent blood clots from forming, control your heart rate, or restore your heartbeat to a normal rhythm. If this condition is not treated, it can cause serious problems, such as a weakened heart muscle (cardiomyopathy) or a stroke. What are the causes? This condition is often caused by medical conditions that damage the heart's electrical system. These include:  High blood pressure (hypertension). This is the most common cause.  Certain heart problems or conditions, such as heart failure, coronary artery disease, heart valve problems, or heart surgery.  Diabetes.  Overactive thyroid (hyperthyroidism).  Obesity.  Chronic kidney disease. In some cases, the cause of this condition is not known. What increases the risk? This condition is more likely to develop in:  Older people.  People who smoke.  Athletes  who do endurance exercise.  People who have a family history of atrial fibrillation.  Men.  People who use drugs.  People who drink a lot of alcohol.  People who have lung conditions, such as emphysema, pneumonia, or COPD.  People who have obstructive sleep apnea. What are the signs or symptoms? Symptoms of this condition include:  A feeling that your heart is racing or beating irregularly.  Discomfort or pain in your chest.  Shortness of breath.  Sudden light-headedness or weakness.  Tiring easily during exercise or activity.  Fatigue.  Syncope (fainting).  Sweating. In some cases, there are no symptoms. How is this diagnosed? Your health care provider may detect atrial fibrillation when taking your pulse. If detected, this condition may be diagnosed with:  An electrocardiogram (ECG) to check electrical signals of the heart.  An ambulatory cardiac monitor to record your heart's activity for a few days.  A transthoracic echocardiogram (TTE) to create pictures of your heart.  A transesophageal echocardiogram (TEE) to create even closer pictures of your heart.  A stress test to check your blood supply while you exercise.  Imaging tests, such as a CT scan or chest X-ray.  Blood tests. How is this treated? Treatment depends on underlying conditions and how you feel when you experience atrial fibrillation. This condition may be treated with:  Medicines to prevent blood clots or to treat heart rate or heart rhythm problems.  Electrical cardioversion to reset the heart's rhythm.  A pacemaker to correct abnormal heart rhythm.  Ablation to remove the heart tissue that sends abnormal signals.  Left atrial  appendage closure to seal the area where blood clots can form. In some cases, underlying conditions will be treated. Follow these instructions at home: Medicines  Take over-the counter and prescription medicines only as told by your health care provider.  Do  not take any new medicines without talking to your health care provider.  If you are taking blood thinners: ? Talk with your health care provider before you take any medicines that contain aspirin or NSAIDs, such as ibuprofen. These medicines increase your risk for dangerous bleeding. ? Take your medicine exactly as told, at the same time every day. ? Avoid activities that could cause injury or bruising, and follow instructions about how to prevent falls. ? Wear a medical alert bracelet or carry a card that lists what medicines you take. Lifestyle      Do not use any products that contain nicotine or tobacco, such as cigarettes, e-cigarettes, and chewing tobacco. If you need help quitting, ask your health care provider.  Eat heart-healthy foods. Talk with a dietitian to make an eating plan that is right for you.  Exercise regularly as told by your health care provider.  Do not drink alcohol.  Lose weight if you are overweight.  Do not use drugs, including cannabis. General instructions  If you have obstructive sleep apnea, manage your condition as told by your health care provider.  Do not use diet pills unless your health care provider approves. Diet pills can make heart problems worse.  Keep all follow-up visits as told by your health care provider. This is important. Contact a health care provider if you:  Notice a change in the rate, rhythm, or strength of your heartbeat.  Are taking a blood thinner and you notice more bruising.  Tire more easily when you exercise or do heavy work.  Have a sudden change in weight. Get help right away if you have:   Chest pain, abdominal pain, sweating, or weakness.  Trouble breathing.  Side effects of blood thinners, such as blood in your vomit, stool, or urine, or bleeding that cannot stop.  Any symptoms of a stroke. "BE FAST" is an easy way to remember the main warning signs of a stroke: ? B - Balance. Signs are dizziness,  sudden trouble walking, or loss of balance. ? E - Eyes. Signs are trouble seeing or a sudden change in vision. ? F - Face. Signs are sudden weakness or numbness of the face, or the face or eyelid drooping on one side. ? A - Arms. Signs are weakness or numbness in an arm. This happens suddenly and usually on one side of the body. ? S - Speech. Signs are sudden trouble speaking, slurred speech, or trouble understanding what people say. ? T - Time. Time to call emergency services. Write down what time symptoms started.  Other signs of a stroke, such as: ? A sudden, severe headache with no known cause. ? Nausea or vomiting. ? Seizure. These symptoms may represent a serious problem that is an emergency. Do not wait to see if the symptoms will go away. Get medical help right away. Call your local emergency services (911 in the [place] Do not drive yourself to the hospital. Summary  Atrial fibrillation is a type of irregular or rapid heartbeat (arrhythmia).  Symptoms include a feeling that your heart is beating fast or irregularly.  You may be given medicines to prevent blood clots or to treat heart rate or heart rhythm problems.  Get help right away  if you have signs or symptoms of a stroke.  Get help right away if you cannot catch your breath or have chest pain or pressure. This information is not intended to replace advice given to you by your health care provider. Make sure you discuss any questions you have with your health care provider. Document Revised: 08/29/2018 Document Reviewed: 08/29/2018 Elsevier Patient Education  2020 [employer].

## 2020-01-10 NOTE — Progress Notes (Signed)
I agree with the above plan 

## 2020-02-12 ENCOUNTER — Institutional Professional Consult (permissible substitution): Payer: Medicare HMO | Admitting: Neurology

## 2020-02-27 ENCOUNTER — Ambulatory Visit (INDEPENDENT_AMBULATORY_CARE_PROVIDER_SITE_OTHER): Payer: Medicare HMO | Admitting: Neurology

## 2020-02-27 ENCOUNTER — Encounter: Payer: Self-pay | Admitting: Neurology

## 2020-02-27 VITALS — BP 160/88 | Ht 64.0 in | Wt 214.0 lb

## 2020-02-27 DIAGNOSIS — I4891 Unspecified atrial fibrillation: Secondary | ICD-10-CM

## 2020-02-27 DIAGNOSIS — Z8673 Personal history of transient ischemic attack (TIA), and cerebral infarction without residual deficits: Secondary | ICD-10-CM | POA: Diagnosis not present

## 2020-02-27 DIAGNOSIS — I63412 Cerebral infarction due to embolism of left middle cerebral artery: Secondary | ICD-10-CM | POA: Diagnosis not present

## 2020-02-27 DIAGNOSIS — Z9189 Other specified personal risk factors, not elsewhere classified: Secondary | ICD-10-CM

## 2020-02-27 DIAGNOSIS — R351 Nocturia: Secondary | ICD-10-CM

## 2020-02-27 DIAGNOSIS — G4700 Insomnia, unspecified: Secondary | ICD-10-CM

## 2020-02-27 NOTE — Patient Instructions (Addendum)
It was nice to meet you today!  Here is what we discussed today and what we came up with as our plan for you:    Based on your symptoms and your exam I believe you are at risk for obstructive sleep apnea (aka OSA), and I think we should proceed with a sleep study to determine whether you do or do not have OSA and how severe it is. Even, if you have mild OSA, I may want you to consider treatment with CPAP, as treatment of even borderline or mild sleep apnea can result and improvement of symptoms such as sleep disruption, daytime sleepiness, nighttime bathroom breaks, restless leg symptoms, improvement of headache syndromes, even improved mood disorder.   As explained, an attended sleep study meaning you get to stay overnight in the sleep lab, lets [place] monitor sleep-related behaviors such as sleep talking and leg movements in sleep, in addition to monitoring for sleep apnea.  A home sleep test is a screening tool for sleep apnea only, and unfortunately does not help with any other sleep-related diagnoses.  I understand your concern that you will not be able to sleep enough during the sleep study in the lab.  As discussed, we will go ahead with a home sleep test for now.  Please remember, the long-term risks and ramifications of untreated moderate to severe obstructive sleep apnea are: increased Cardiovascular disease, including congestive heart failure, stroke, difficult to control hypertension, treatment resistant obesity, arrhythmias, especially irregular heartbeat commonly known as A. Fib. (atrial fibrillation); even type 2 diabetes has been linked to untreated OSA.   Sleep apnea can cause disruption of sleep and sleep deprivation in most cases, which, in turn, can cause recurrent headaches, problems with memory, mood, concentration, focus, and vigilance. Most people with untreated sleep apnea report excessive daytime sleepiness, which can affect their ability to drive. Please do not drive if you feel  sleepy. Patients with sleep apnea can also develop difficulty initiating and maintaining sleep (aka insomnia).   Having sleep apnea may increase your risk for other sleep disorders, including involuntary behaviors sleep such as sleep terrors, sleep talking, sleepwalking.    Having sleep apnea can also increase your risk for restless leg syndrome and leg movements at night.   Please note that untreated obstructive sleep apnea may carry additional perioperative morbidity. Patients with significant obstructive sleep apnea (typically, in the moderate to severe degree) should receive, if possible, perioperative PAP (positive airway pressure) therapy and the surgeons and particularly the anesthesiologists should be informed of the diagnosis and the severity of the sleep disordered breathing.   I will likely see you back after your sleep study to go over the test results and where to go from there. We will call you after your sleep study to advise about the results (most likely, you will hear from [facility], my nurse) and to set up an appointment at the time, as necessary.    Our sleep lab administrative assistant will call you to schedule your sleep study and give you further instructions, regarding the check in process for the sleep study, arrival time, what to bring, when you can expect to leave after the study, etc., and to answer any other logistical questions you may have. If you don't hear back from her by about 2 weeks from now, please feel free to call her direct line at [phone] or you can call our general clinic number, or email [place] through My Chart.

## 2020-02-27 NOTE — Progress Notes (Signed)
Subjective:    Patient ID: Stephanie LimaBetsy D Rodgers is a 79 y.o. female.  HPI     Stephanie FoleySaima Camaria Gerald, MD, PhD [facility] [address], [address] [address] [place], [place] [id number]  Dear Stephanie Rodgers and Stephanie Rodgers,   I saw your patient, Stephanie Rodgers, upon your kind Request in my sleep clinic today for initial consultation of her sleep disorder, in particular, concern for underlying obstructive sleep apnea.  The patient is unaccompanied today.  As you know, Ms. Stephanie Rodgers is a 79 year old right-handed woman with an underlying medical history of hypertension, hyperlipidemia, depression atrial fibrillation, left MCA embolic stroke in September 2021, and obesity, who reports snoring and chronic difficulty initiating or maintaining sleep.  She has primarily difficulty going to sleep and this has been going on for years.  She has taken clonazepam off and on at bedtime as needed.  She currently does not have a prescription for it.  In the past, several years ago, she tried Ambien and slept well with it but had some parasomnias including sleep related eating and sleepwalking, her PCP stopped the medication.  I reviewed your office note from 01/08/2020.  Her Epworth sleepiness score is 0 out of 24, fatigue severity score is 11 out of 63.  She goes to bed between 11 and midnight on most nights, rise time between 9 and 10.  She has nocturia about 2-3 times per average night and denies recurrent morning headaches.  She has no family history of sleep apnea.  She had a tonsillectomy as a teenager.  Her husband has sleep apnea and has a CPAP machine and she is not sure if she would be able to tolerate her machine.  She is apprehensive about coming in for sleep study but would consider home sleep test. She had a cardioversion on 01/31/2020.  She lives with her husband.  She has 2 grown children, 1 son, 1 daughter, her son also lives with them.  She drinks caffeine in the form of coffee, 1 cup/day, does not smoke or  drink alcohol.  Her Past Medical History Is Significant For: Past Medical History:  Diagnosis Date  . Atrial fibrillation (HCC)   . Depression   . Hyperlipidemia   . Hypertension   . RLS (restless legs syndrome)   . Stroke [facility](HCC)     Her Past Surgical History Is Significant For: No past surgical history on file.  Her Family History Is Significant For: No family history on file.  Her Social History Is Significant For: Social History   Socioeconomic History  . Marital status: Married    Spouse name: Not on file  . Number of children: Not on file  . Years of education: Not on file  . Highest education level: Not on file  Occupational History  . Not on file  Tobacco Use  . Smoking status: Never Smoker  . Smokeless tobacco: Never Used  Substance and Sexual Activity  . Alcohol use: Not Currently  . Drug use: Not on file  . Sexual activity: Not on file  Other Topics Concern  . Not on file  Social History Narrative  . Not on file   Social Determinants of Health   Financial Resource Strain: Not on file  Food Insecurity: Not on file  Transportation Needs: Not on file  Physical Activity: Not on file  Stress: Not on file  Social Connections: Not on file    Her Allergies Are:  Allergies  Allergen Reactions  . Alendronate Other (See  Comments)    Edema, joint pain Edema, joint pain   . Ciprofloxacin Other (See Comments)    Abdominal pain, fatigue Abdominal pain, fatigue   . Hydrochlorothiazide Other (See Comments)    gout gout   . Penicillins Other (See Comments) and Rash    Welts Welts Welts Welts   . Colesevelam Other (See Comments)    unknown  :   Her Current Medications Are:  Outpatient Encounter Medications as of 02/27/2020  Medication Sig  . apixaban (ELIQUIS) 5 MG TABS tablet Take 1 tablet (5 mg total) by mouth 2 (two) times daily.  . Cholecalciferol (VITAMIN D3 PO) Take 1 tablet by mouth daily.  . clonazePAM (KLONOPIN) 0.5 MG tablet Take 0.5 mg  by mouth daily as needed (sleep).   . Cyanocobalamin (B-12 PO) Take 1 capsule by mouth daily.  . flecainide (TAMBOCOR) 50 MG tablet Take 50 mg by mouth 2 (two) times daily.  . irbesartan (AVAPRO) 300 MG tablet Take 300 mg by mouth daily.  Marland Kitchen loratadine (CLARITIN) 10 MG tablet Take 10 mg by mouth daily as needed for allergies.  . metoprolol tartrate (LOPRESSOR) 50 MG tablet Take 1 tablet (50 mg total) by mouth 2 (two) times daily.  . rosuvastatin (CRESTOR) 20 MG tablet Take 20 mg by mouth daily.  . Soft Lens Products (B & L SENSITIVE EYES SALINE) 0.4 % SOLN Place 1 drop into both eyes daily as needed (dry eyes).   No facility-administered encounter medications on file as of 02/27/2020.  :  Review of Systems:  Out of a complete 14 point review of systems, all are reviewed and negative with the exception of these symptoms as listed below: Review of Systems  Neurological:       Rm 1 New Pt here alone for sleep consult, hx CVA A fib. I have problem going to sleep, staying asleep. Epworth Sleepiness Scale 0= would never doze 1= slight chance of dozing 2= moderate chance of dozing 3= high chance of dozing  Sitting and reading:0 Watching TV:0 Sitting inactive in a public place (ex. Theater or meeting):0 As a passenger in a car for an hour without a break:0 Lying down to rest in the afternoon:0 Sitting and talking to someone:0 Sitting quietly after lunch (no alcohol):0 In a car, while stopped in traffic:0 Total:0    Objective:  Neurological Exam  Physical Exam Physical Examination:   Vitals:   02/27/20 1351  BP: (!) 160/88    General Examination: The patient is a very pleasant 79 y.o. female in no acute distress. She appears well-developed and well-nourished and well groomed.   HEENT: Normocephalic, atraumatic, pupils are equal, round and reactive to light, extraocular tracking is good without limitation to gaze excursion or nystagmus noted. Hearing is grossly intact. Face is  symmetric with normal facial animation. Speech is clear with no dysarthria noted. There is no hypophonia. There is no lip, neck/head, jaw or voice tremor. Neck is supple with full range of passive and active motion. There are no carotid bruits on auscultation. Oropharynx exam reveals: mild mouth dryness, adequate dental hygiene and mild airway crowding, due to redundant soft palate and small airway entry.  Tonsils absent.  Mallampati class II, neck circumference of 15 inches.  Tongue protrudes centrally and palate elevates symmetrically.  Chest: Clear to auscultation without wheezing, rhonchi or crackles noted.  Heart: S1+S2+0, regular and normal without murmurs, rubs or gallops noted.   Abdomen: Soft, non-tender and non-distended with normal bowel sounds appreciated on auscultation.  Extremities: There is 1+ pitting edema in the distal lower extremities bilaterally.   Skin: Warm and dry without trophic changes noted.   Musculoskeletal: exam reveals no obvious joint deformities, tenderness or joint swelling or erythema.   Neurologically:  Mental status: The patient is awake, alert and oriented in all 4 spheres. Her immediate and remote memory, attention, language skills and fund of knowledge are appropriate. There is no evidence of aphasia, agnosia, apraxia or anomia. Speech is clear with normal prosody and enunciation. Thought process is linear. Mood is normal and affect is normal.  Cranial nerves II - XII are as described above under HEENT exam.  Motor exam: Normal bulk, strength and tone is noted. There is no tremor. Fine motor skills and coordination: grossly intact.  Cerebellar testing: No dysmetria or intention tremor. There is no truncal or gait ataxia.  Sensory exam: intact to light touch in the upper and lower extremities.                Assessment and Plan:   In summary, Stephanie Rodgers is a very pleasant 69 y.o.-year old female with an underlying medical history of hypertension,  hyperlipidemia, depression atrial fibrillation, left MCA embolic stroke in September 2021, and obesity, whose history and physical exam are concerning for obstructive sleep apnea (OSA). I had a long chat with the patient about my findings and the diagnosis of OSA, its prognosis and treatment options. We talked about medical treatments, surgical interventions and non-pharmacological approaches. I explained in particular the risks and ramifications of untreated moderate to severe OSA, especially with respect to developing cardiovascular disease down the Road, including congestive heart failure, difficult to treat hypertension, cardiac arrhythmias, or stroke. Even type 2 diabetes has, in part, been linked to untreated OSA. Symptoms of untreated OSA include daytime sleepiness, memory problems, mood irritability and mood disorder such as depression and anxiety, lack of energy, as well as recurrent headaches, especially morning headaches. We talked about trying to maintain a healthy lifestyle in general, as well as the importance of weight control. We also talked about the importance of good sleep hygiene. I recommended the following at this time: sleep study.  She believes she will not be able to sleep in the sleep lab.  She is agreeable to pursuing a home sleep test for now.  I explained the sleep test procedure to the patient and also outlined possible surgical and non-surgical treatment options of OSA, including the use of a custom-made dental device (which would require a referral to a specialist dentist or oral surgeon), upper airway surgical options, such as traditional UPPP or a novel less invasive surgical option in the form of Inspire hypoglossal nerve stimulation (which would involve a referral to an ENT surgeon). I also explained the CPAP treatment option to the patient, who indicated that she does not foresee that she would be able to tolerate CPAP therapy but would be willing to try if the need arises. I  explained the importance of being compliant with PAP treatment, not only for insurance purposes but primarily to improve Her symptoms, and for the patient's long term health benefit, including to reduce Her cardiovascular risks. I answered all her questions today and the patient was in agreement. I plan to see her back after the sleep study is completed and encouraged her to call with any interim questions, concerns, problems or updates.   Thank you very much for allowing me to participate in the care of this nice patient. If I  can be of any further assistance to you please do not hesitate to talk to me.  Sincerely,   Stephanie Foley, MD, PhD

## 2020-03-30 ENCOUNTER — Ambulatory Visit (INDEPENDENT_AMBULATORY_CARE_PROVIDER_SITE_OTHER): Payer: Medicare HMO | Admitting: Neurology

## 2020-03-30 DIAGNOSIS — R351 Nocturia: Secondary | ICD-10-CM

## 2020-03-30 DIAGNOSIS — Z8673 Personal history of transient ischemic attack (TIA), and cerebral infarction without residual deficits: Secondary | ICD-10-CM

## 2020-03-30 DIAGNOSIS — Z9189 Other specified personal risk factors, not elsewhere classified: Secondary | ICD-10-CM

## 2020-03-30 DIAGNOSIS — G4733 Obstructive sleep apnea (adult) (pediatric): Secondary | ICD-10-CM | POA: Diagnosis not present

## 2020-03-30 DIAGNOSIS — I63412 Cerebral infarction due to embolism of left middle cerebral artery: Secondary | ICD-10-CM

## 2020-03-30 DIAGNOSIS — G4700 Insomnia, unspecified: Secondary | ICD-10-CM

## 2020-03-30 DIAGNOSIS — I4891 Unspecified atrial fibrillation: Secondary | ICD-10-CM

## 2020-04-07 ENCOUNTER — Telehealth: Payer: Self-pay | Admitting: Neurology

## 2020-04-07 NOTE — Progress Notes (Signed)
Patient referred by Dr. Pearlean Brownie and Shanda Bumps, seen by me on 02/27/20, patient had a HST on 03/30/20.    Please call and notify the patient that the recent home sleep test showed obstructive sleep apnea in the moderate range. I recommend treatment for this in the form of autoPAP, which means, that we don't have to bring her in for a sleep study with CPAP, but will let her start using a so called autoPAP machine at home, through a DME company (of her choice, or as per insurance requirement). The DME representative will fit the patient with a mask of choice, educate her on how to use the machine, how to put the mask on, etc. I have placed an order in the chart. Please send the order to a local DME, talk to patient, send report to referring MD. Please also reinforce the need for compliance with treatment. We will need a FU in sleep clinic for 10 weeks post-PAP set up, please arrange that with me or one of our NPs. Thanks,   Huston Foley, MD, PhD [facility])

## 2020-04-07 NOTE — Addendum Note (Signed)
Addended by: Huston Foley on: 04/07/2020 11:39 AM   Modules accepted: Orders

## 2020-04-07 NOTE — Procedures (Signed)
   [facility]  HOME SLEEP TEST (Watch PAT)  STUDY DATE: 03/30/20  DOB: 07-09-1940  MRN: [id number]  ORDERING CLINICIAN: Huston Foley, MD, PhD   REFERRING CLINICIAN: Dr. Pearlean Brownie   CLINICAL INFORMATION/HISTORY: 80 year old woman with a history of hypertension, hyperlipidemia, depression atrial fibrillation, left MCA embolic stroke in September 2021, and obesity, who reports snoring and chronic difficulty initiating or maintaining sleep.   Epworth sleepiness score: 0/24.  BMI: 36.5 kg/m  Neck Circumference: 15 "  FINDINGS:   Total Record Time (hours, min): 5 H 54 min  Total Sleep Time (hours, min):  4 H 55 min   Percent REM (%):    19.60 %   Calculated pAHI (per hour): 24.5       REM pAHI:  40.4     NREM pAHI: 20.4 Supine AHI: 27.9   Oxygen Saturation (%) Mean: 93  Minimum oxygen saturation (%):         79   O2 Saturation Range (%): 79-97  O2Saturation (minutes) <=88%: 0.1 min  Pulse Mean (bpm):    57  Pulse Range (45-74)   IMPRESSION: OSA (obstructive sleep apnea)   RECOMMENDATION:  This home sleep test demonstrates moderate obstructive sleep apnea with a total AHI of 24.5/hour and O2 nadir of 79%. Intermittent, mild to moderate snoring was noted. Treatment with positive airway pressure is recommended. The patient will be advised to proceed with an autoPAP titration/trial at home for now. A full night titration study may be considered to optimize treatment settings, if needed down the road. Please note that untreated obstructive sleep apnea may carry additional perioperative morbidity. Patients with significant obstructive sleep apnea should receive perioperative PAP therapy and the surgeons and particularly the anesthesiologist should be informed of the diagnosis and the severity of the sleep disordered breathing. The patient should be cautioned not to drive, work at heights, or operate dangerous or heavy equipment when tired or sleepy. Review and  reiteration of good sleep hygiene measures should be pursued with any patient. Other causes of the patient's symptoms, including circadian rhythm disturbances, an underlying mood disorder, medication effect and/or an underlying medical problem cannot be ruled out based on this test. Clinical correlation is recommended. The patient and her referring provider will be notified of the test results. The patient will be seen in follow up in sleep clinic at [facility].  I certify that I have reviewed the raw data recording prior to the issuance of this report in accordance with the standards of the American Academy of Sleep Medicine (AASM).  INTERPRETING PHYSICIAN:  Huston Foley, MD, PhD  Board Certified in Neurology and Sleep Medicine  [facility] [address], [address] [place], [place] [address] [phone]

## 2020-04-07 NOTE — Telephone Encounter (Signed)
-----   Message from Huston Foley, MD sent at 04/07/2020 11:39 AM EST ----- Patient referred by Dr. Pearlean Brownie and Shanda Bumps, seen by me on 02/27/20, patient had a HST on 03/30/20.    Please call and notify the patient that the recent home sleep test showed obstructive sleep apnea in the moderate range. I recommend treatment for this in the form of autoPAP, which means, that we don't have to bring her in for a sleep study with CPAP, but will let her start using a so called autoPAP machine at home, through a DME company (of her choice, or as per insurance requirement). The DME representative will fit the patient with a mask of choice, educate her on how to use the machine, how to put the mask on, etc. I have placed an order in the chart. Please send the order to a local DME, talk to patient, send report to referring MD. Please also reinforce the need for compliance with treatment. We will need a FU in sleep clinic for 10 weeks post-PAP set up, please arrange that with me or one of our NPs. Thanks,   Huston Foley, MD, PhD [facility])

## 2020-04-07 NOTE — Telephone Encounter (Signed)
I called pt. I advised pt that Dr. Frances Furbish reviewed their sleep study results and found that pt has moderate osa. Dr. Frances Furbish recommends that pt start an auto pap at home. I reviewed PAP compliance expectations with the pt. Pt is agreeable to starting an auto-PAP. I advised pt that an order will be sent to a DME, [facility], and [facility] will call the pt within about one week after they file with the pt's insurance. [facility] will show the pt how to use the machine, fit for masks, and troubleshoot the auto-PAP if needed. A follow up appt was made for insurance purposes with Shanda Bumps, NP on 08/06/20 at 1:45pm. Pt verbalized understanding to arrive 15 minutes early and bring their auto-PAP. A letter with all of this information in it will be mailed to the pt as a reminder. I verified with the pt that the address we have on file is correct. Pt verbalized understanding of results. Pt had no questions at this time but was encouraged to call back if questions arise. I have sent the order to [facility] and have received confirmation that they have received the order.

## 2020-04-07 NOTE — Telephone Encounter (Signed)
Pt called, been 2 weeks have not gotten results of the home sleep study. Would like a call from the nurse.

## 2020-05-13 ENCOUNTER — Ambulatory Visit (INDEPENDENT_AMBULATORY_CARE_PROVIDER_SITE_OTHER): Payer: Medicare HMO | Admitting: Adult Health

## 2020-05-13 ENCOUNTER — Other Ambulatory Visit: Payer: Self-pay

## 2020-05-13 ENCOUNTER — Encounter: Payer: Self-pay | Admitting: Adult Health

## 2020-05-13 VITALS — BP 150/82 | HR 52 | Ht 64.0 in

## 2020-05-13 DIAGNOSIS — I63412 Cerebral infarction due to embolism of left middle cerebral artery: Secondary | ICD-10-CM

## 2020-05-13 NOTE — Patient Instructions (Signed)
Your Plan:  Continue current treatment plan for secondary stroke prevention with routine follow-up with PCP and cardiology  You have a follow-up visit scheduled in May for initial CPAP compliance visit -please ensure this visit falls within 60 to 90 days after receiving new machine.  Please call if appointment needs to be adjusted  When you receive your machine, we will also review different mask options with company - we discussed [employer] nasal mask today        Thank you for coming to see [place] at [facility]. I hope we have been able to provide you high quality care today.  You may receive a patient satisfaction survey over the next few weeks. We would appreciate your feedback and comments so that we may continue to improve ourselves and the health of our patients.

## 2020-05-13 NOTE — Progress Notes (Signed)
[facility] [address] [place].  [address] [phone]       HOSPITAL FOLLOW UP NOTE  Ms. Stephanie Rodgers Date of Birth:  04/04/1940 Medical Record Number:  [id number]   Reason for Referral:  hospital stroke follow up    SUBJECTIVE:   CHIEF COMPLAINT:  Chief Complaint  Patient presents with  . Follow-up    RM 14 alone Pt  is having sleep issues, have seen sleep lab and waiting on cpap.    HPI:   Today, 05/13/2020, Stephanie Rodgers returns for 80-month stroke follow-up unaccompanied.  Recovered well from stroke standpoint without residual deficit Denies new or worsening stroke/TIA symptoms  Reports compliance on Eliquis and Crestor -denies side effects Blood pressure today initially elevated and on recheck 150/82 -she has been monitoring at home with recent BP med adjustments and has been ranging 150-160s/80s  S/p successful cardioversion and has remained in NSR  Underwent HST 03/2020 which showed moderate sleep apnea with total AHI of 24.5/h and recommended initiating AutoPap titration/trial at home -she is currently awaiting machine  No new concerns at this time     History provided for reference purposes only Initial visit 01/08/2020 JM: Stephanie Rodgers is being seen for hospital follow-up accompanied by her son.  She reports she has recovered well from a stroke standpoint with occasional mild imbalance and continues to work with [facility] PT with continued improvement.  Denies new stroke/TIA symptoms.  She does report excessive daytime fatigue and decreased activity tolerance and has been told this is likely due to her atrial fibrillation.  She continues to follow with cardiology and currently awaiting results of her 48-hour cardiac monitoring.  She has remained on Eliquis without bleeding or bruising.  Remains on Crestor 20 mg daily without myalgias.  Blood pressure today elevated but routinely monitors at home and typically ranges 130-150s/80s-90s.  She does  report chronic history of insomnia and currently on clonazepam per PCP but denies prior daytime fatigue, snoring, headaches or witnessed apneas.  She has not previously underwent evaluation for potential sleep apnea.  No further concerns at this time.  Stroke admission 12/08/2019 Stephanie Reagor Reaganis a 80 y.o.femalewith history of hypertension, hyperlipidemia, and depression/anxietywho presented on 12/08/2019 with speech difficulties - noted to be in Afib in ER (new diagnosis).  Personally reviewed hospitalization pertinent progress notes, lab work and imaging with summary provided.  Evaluated by Dr. Roda Shutters with stroke work-up revealing punctate and small patchy acute left MCA infarcts, embolic secondary to newly diagnosed atrial fibrillation.  Repeat MRI showed new left occipital parietal cortex infarct.  Initiated Eliquis for new diagnosis of A. fib and secondary stroke prevention.  HTN stable and recommended long-term BP goal 130-150 given left M2 high-grade stenosis.  LDL 78 and continued Crestor 20 mg daily.  Other stroke risk factors include advanced age and obesity but no prior stroke history.  Evaluated by therapy and recommended discharge home with home health PT/OT/SLP and 24/7 supervision.  Stroke: punctate and small patchy acute left MCA infarcts - embolic secondary to new diagnosed atrial fibrillation.  CT head - normal head CT  MRI head - Punctate acute infarct within the left frontal operculum, and small subacute infarct of the left periatrial white matter.   MRA head and neck - Short segment occlusion of the left MCA M2 segment.   CTA Head - There is clot in two branches of the inferior division left MCA which are nonocclusive.Probable emboli. Superior division left MCA patent. Left M1  segment patent.   CT Perfusion - Negative for core infarct. There is 31 mL of delayed perfusion on T-max greater than 4 seconds involving the left temporoparietal lobe.   MRI repeat -new L occipital  parietal cortex infarct. No change in L mid frontal cortex and L temporoparietal White matter recent infarcts.  Carotid Doppler-B ICA 1-39% stenosis, VAs antegrade  2D Echo - EF 60-56%. LA severely dilated  [employer] Virus 2 - negative  LDL -78  HgbA1c-6.0  No antithromboticprior to admission,now onEliquis. Continue on discharge  Therapy recommendations:HHOT, OP PT, OP SLP, 247 supervision   Disposition: return home w/ HH      ROS:   14 system review of systems performed and negative with exception of those listed in HPI  PMH:  Past Medical History:  Diagnosis Date  . Atrial fibrillation (HCC)   . Depression   . Hyperlipidemia   . Hypertension   . RLS (restless legs syndrome)   . Stroke [facility])     PSH: History reviewed. No pertinent surgical history.  Social History:  Social History   Socioeconomic History  . Marital status: Married    Spouse name: Not on file  . Number of children: Not on file  . Years of education: Not on file  . Highest education level: Not on file  Occupational History  . Not on file  Tobacco Use  . Smoking status: Never Smoker  . Smokeless tobacco: Never Used  Substance and Sexual Activity  . Alcohol use: Not Currently  . Drug use: Not on file  . Sexual activity: Not on file  Other Topics Concern  . Not on file  Social History Narrative  . Not on file   Social Determinants of Health   Financial Resource Strain: Not on file  Food Insecurity: Not on file  Transportation Needs: Not on file  Physical Activity: Not on file  Stress: Not on file  Social Connections: Not on file  Intimate Partner Violence: Not on file    Family History: History reviewed. No pertinent family history.  Medications:   Current Outpatient Medications on File Prior to Visit  Medication Sig Dispense Refill  . apixaban (ELIQUIS) 5 MG TABS tablet Take 1 tablet (5 mg total) by mouth 2 (two) times daily. 60 tablet 2  . Cholecalciferol  (VITAMIN D3 PO) Take 1 tablet by mouth daily.    . clonazePAM (KLONOPIN) 0.5 MG tablet Take 0.5 mg by mouth daily as needed (sleep).     . Cyanocobalamin (B-12 PO) Take 1 capsule by mouth daily.    . flecainide (TAMBOCOR) 50 MG tablet Take 50 mg by mouth 2 (two) times daily.    . irbesartan (AVAPRO) 300 MG tablet Take by mouth.    . loratadine (CLARITIN) 10 MG tablet Take 10 mg by mouth daily as needed for allergies.    . metoprolol tartrate (LOPRESSOR) 50 MG tablet Take 1 tablet (50 mg total) by mouth 2 (two) times daily. 60 tablet 2  . rosuvastatin (CRESTOR) 20 MG tablet Take 20 mg by mouth daily.    . Soft Lens Products (B & L SENSITIVE EYES SALINE) 0.4 % SOLN Place 1 drop into both eyes daily as needed (dry eyes).     No current facility-administered medications on file prior to visit.    Allergies:   Allergies  Allergen Reactions  . Alendronate Other (See Comments)    Edema, joint pain Edema, joint pain   . Ciprofloxacin Other (See Comments)  Abdominal pain, fatigue Abdominal pain, fatigue   . Hydrochlorothiazide Other (See Comments)    gout gout   . Penicillins Other (See Comments) and Rash    Welts Welts Welts Welts   . Colesevelam Other (See Comments)    unknown      OBJECTIVE:  Physical Exam  Vitals:   05/13/20 1435  BP: (!) 150/82  Pulse: (!) 52  Height: 5\' 4"  (1.626 m)   Body mass index is 36.73 kg/m. No exam data present  General: well developed, well nourished,  very pleasant elderly Caucasian female, seated, in no evident distress Head: head normocephalic and atraumatic.   Neck: supple with no carotid or supraclavicular bruits Cardiovascular: Tachycardic irregular rate and rhythm, no murmurs Musculoskeletal: no deformity; limited bilateral shoulder ROM 2/2 pain vs ?  Arthritis Skin:  no rash/petichiae Vascular:  Normal pulses all extremities   Neurologic Exam Mental Status: Awake and fully alert.   Fluent speech and language.  Oriented to  place and time. Recent and remote memory intact. Attention span, concentration and fund of knowledge appropriate. Mood and affect appropriate.  Cranial Nerves: Pupils equal, briskly reactive to light. Extraocular movements full without nystagmus. Visual fields full to confrontation. Hearing intact. Facial sensation intact. Face, tongue, palate moves normally and symmetrically.  Motor: Normal bulk and tone. Normal strength in all tested extremity muscles. Sensory.: intact to touch , pinprick , position and vibratory sensation.  Coordination: Rapid alternating movements normal in all extremities. Finger-to-nose and heel-to-shin performed accurately bilaterally. Gait and Station: Arises from chair without difficulty. Stance is normal. Gait demonstrates normal stride length and balance without use of assistive device.  Able to tandem walk and heel toe with mild difficulty. Reflexes: 1+ and symmetric. Toes downgoing.         ASSESSMENT: Stephanie Rodgers is a 80 y.o. year old female presented with speech difficulties on 12/08/2019 with stroke work-up revealing punctate and small patchy acute left MCA and PCA infarcts, embolic secondary to new diagnosis of atrial fibrillation. Vascular risk factors include persistent atrial fibrillation, HTN, HLD and new diagnosis of moderate sleep apnea.      PLAN:  1. L MCA and PCA stroke, embolic:  a. Recovered without residual deficit b. Continue Eliquis (apixaban) daily  and Crestor for secondary stroke prevention.  c. Discussed secondary stroke prevention measures and importance of close PCP follow up for aggressive stroke risk factor management  2. Persistent atrial fibrillation, new dx:  a. On Eliquis 5 mg twice daily for CHA2DS2-VASc score of at least 5  b. S/p cardioversion 01/31/2020 c. Routinely followed by cardiology 3. At risk for sleep apnea:  a. Dx with moderate sleep apnea per HST 03/2020 and recommend initiating AutoPap -currently awaiting  machine from DME company 4. HTN: BP goal 130-150/90 to ensure adequate perfusion.  Stable on current regimen monitored by PCP 5. HLD: LDL goal <70. Continued home dose Crestor 20 mg daily per PCP     Follow-up scheduled in May for initial CPAP compliance visit.  Advised to call prior if needed with any questions or concerns   CC:  [facility] provider: Dr. [facility]    I spent 30 minutes of face-to-face and non-face-to-face time with patient.  This included previsit chart review, lab review, study review, order entry, electronic health record documentation, patient education regarding prior stroke and etiology, new diagnosis of sleep apnea and importance of initiating CPAP, importance of managing stroke risk factors and answered all other questions to  patient satisfaction   Ihor AustinJessica McCue, AGNP-BC  [facility] [address] [place], [place] [id number]  Phone [phone] Fax [phone] Note: This document was prepared with digital dictation and possible smart phrase technology. Any transcriptional errors that result from this process are unintentional.

## 2020-05-19 NOTE — Progress Notes (Signed)
I agree with the above plan 

## 2020-05-21 NOTE — Telephone Encounter (Signed)
Pt called stating that she has reached out to [facility] and they informed her that they have not received the order for her. Pt states they informed her to tell us to call them and tell them to "proceed" for the pt. Please advise.

## 2020-05-21 NOTE — Telephone Encounter (Signed)
I have reached out to the DME to get update. Order was sent to them 1 month ago.

## 2020-05-22 ENCOUNTER — Telehealth: Payer: Self-pay

## 2020-05-22 NOTE — Telephone Encounter (Signed)
Pt called on 05/21/20 stating she called DME and was advised no order was received for her CPAP.  I reached out to [employer] about this, the following was the information I received.  "Order was processed 04/09/20 and pt was contacted 04/10/20 and informed of the 6-8 week delay in machine availability. We will reach out to pt w/ an update and offer a Luna unit so she can maybe be set up quicker. I apologize for the miscommunication." I called the pt and advised of this information via vm. Pt was advised to call back on Monday if she had any questions/concerns.

## 2020-05-25 NOTE — Telephone Encounter (Signed)
Order was sent on 03/28/2020 to [facility] as discussed.

## 2020-08-06 ENCOUNTER — Ambulatory Visit: Payer: Self-pay | Admitting: Adult Health

## 2020-08-10 ENCOUNTER — Ambulatory Visit: Payer: Medicare HMO | Admitting: Adult Health

## 2020-08-10 NOTE — Progress Notes (Deleted)
[facility] [address] [place]. [place] [address] [phone]       OFFICE FOLLOW UP NOTE  Ms. MISCHELL BRANFORD Date of Birth:  1940/12/15 Medical Record Number:  [id number]   Reason for visit: Initial CPAP compliance visit    SUBJECTIVE:   CHIEF COMPLAINT:  No chief complaint on file.   HPI:   Today, 08/10/2020, Ms. Briere returns for initial CPAP compliance visit after prior visit 2 months ago for stroke follow-up          History provided for reference purposes only Update 05/13/2020 JM: Mrs. Raimondi returns for 56-month stroke follow-up unaccompanied.  Recovered well from stroke standpoint without residual deficit Denies new or worsening stroke/TIA symptoms  Reports compliance on Eliquis and Crestor -denies side effects Blood pressure today initially elevated and on recheck 150/82 -she has been monitoring at home with recent BP med adjustments and has been ranging 150-160s/80s  S/p successful cardioversion and has remained in NSR  Underwent HST 03/2020 which showed moderate sleep apnea with total AHI of 24.5/h and recommended initiating AutoPap titration/trial at home -she is currently awaiting machine  No new concerns at this time   Initial visit 01/08/2020 JM: Ms. Petion is being seen for hospital follow-up accompanied by her son.  She reports she has recovered well from a stroke standpoint with occasional mild imbalance and continues to work with [facility] PT with continued improvement.  Denies new stroke/TIA symptoms.  She does report excessive daytime fatigue and decreased activity tolerance and has been told this is likely due to her atrial fibrillation.  She continues to follow with cardiology and currently awaiting results of her 48-hour cardiac monitoring.  She has remained on Eliquis without bleeding or bruising.  Remains on Crestor 20 mg daily without myalgias.  Blood pressure today elevated but routinely monitors at home and typically ranges  130-150s/80s-90s.  She does report chronic history of insomnia and currently on clonazepam per PCP but denies prior daytime fatigue, snoring, headaches or witnessed apneas.  She has not previously underwent evaluation for potential sleep apnea.  No further concerns at this time.  Stroke admission 12/08/2019 Ms.Jacquel Redditt Reaganis a 80 y.o.femalewith history of hypertension, hyperlipidemia, and depression/anxietywho presented on 12/08/2019 with speech difficulties - noted to be in Afib in ER (new diagnosis).  Personally reviewed hospitalization pertinent progress notes, lab work and imaging with summary provided.  Evaluated by Dr. Roda Shutters with stroke work-up revealing punctate and small patchy acute left MCA infarcts, embolic secondary to newly diagnosed atrial fibrillation.  Repeat MRI showed new left occipital parietal cortex infarct.  Initiated Eliquis for new diagnosis of A. fib and secondary stroke prevention.  HTN stable and recommended long-term BP goal 130-150 given left M2 high-grade stenosis.  LDL 78 and continued Crestor 20 mg daily.  Other stroke risk factors include advanced age and obesity but no prior stroke history.  Evaluated by therapy and recommended discharge home with home health PT/OT/SLP and 24/7 supervision.  Stroke: punctate and small patchy acute left MCA infarcts - embolic secondary to new diagnosed atrial fibrillation.  CT head - normal head CT  MRI head - Punctate acute infarct within the left frontal operculum, and small subacute infarct of the left periatrial white matter.   MRA head and neck - Short segment occlusion of the left MCA M2 segment.   CTA Head - There is clot in two branches of the inferior division left MCA which are nonocclusive.Probable emboli. Superior division left MCA patent. Left  M1 segment patent.   CT Perfusion - Negative for core infarct. There is 31 mL of delayed perfusion on T-max greater than 4 seconds involving the left temporoparietal lobe.    MRI repeat -new L occipital parietal cortex infarct. No change in L mid frontal cortex and L temporoparietal White matter recent infarcts.  Carotid Doppler-B ICA 1-39% stenosis, VAs antegrade  2D Echo - EF 60-56%. LA severely dilated  [employer] Virus 2 - negative  LDL -78  HgbA1c-6.0  No antithromboticprior to admission,now onEliquis. Continue on discharge  Therapy recommendations:HHOT, OP PT, OP SLP, 247 supervision   Disposition: return home w/ HH      ROS:   14 system review of systems performed and negative with exception of those listed in HPI  PMH:  Past Medical History:  Diagnosis Date  . Atrial fibrillation (HCC)   . Depression   . Hyperlipidemia   . Hypertension   . RLS (restless legs syndrome)   . Stroke [facility])     PSH: No past surgical history on file.  Social History:  Social History   Socioeconomic History  . Marital status: Married    Spouse name: Not on file  . Number of children: Not on file  . Years of education: Not on file  . Highest education level: Not on file  Occupational History  . Not on file  Tobacco Use  . Smoking status: Never Smoker  . Smokeless tobacco: Never Used  Substance and Sexual Activity  . Alcohol use: Not Currently  . Drug use: Not on file  . Sexual activity: Not on file  Other Topics Concern  . Not on file  Social History Narrative  . Not on file   Social Determinants of Health   Financial Resource Strain: Not on file  Food Insecurity: Not on file  Transportation Needs: Not on file  Physical Activity: Not on file  Stress: Not on file  Social Connections: Not on file  Intimate Partner Violence: Not on file    Family History: No family history on file.  Medications:   Current Outpatient Medications on File Prior to Visit  Medication Sig Dispense Refill  . apixaban (ELIQUIS) 5 MG TABS tablet Take 1 tablet (5 mg total) by mouth 2 (two) times daily. 60 tablet 2  . Cholecalciferol  (VITAMIN D3 PO) Take 1 tablet by mouth daily.    . clonazePAM (KLONOPIN) 0.5 MG tablet Take 0.5 mg by mouth daily as needed (sleep).     . Cyanocobalamin (B-12 PO) Take 1 capsule by mouth daily.    . flecainide (TAMBOCOR) 50 MG tablet Take 50 mg by mouth 2 (two) times daily.    . irbesartan (AVAPRO) 300 MG tablet Take by mouth.    . loratadine (CLARITIN) 10 MG tablet Take 10 mg by mouth daily as needed for allergies.    . metoprolol tartrate (LOPRESSOR) 50 MG tablet Take 1 tablet (50 mg total) by mouth 2 (two) times daily. 60 tablet 2  . rosuvastatin (CRESTOR) 20 MG tablet Take 20 mg by mouth daily.    . Soft Lens Products (B & L SENSITIVE EYES SALINE) 0.4 % SOLN Place 1 drop into both eyes daily as needed (dry eyes).     No current facility-administered medications on file prior to visit.    Allergies:   Allergies  Allergen Reactions  . Alendronate Other (See Comments)    Edema, joint pain Edema, joint pain   . Ciprofloxacin Other (See Comments)  Abdominal pain, fatigue Abdominal pain, fatigue   . Hydrochlorothiazide Other (See Comments)    gout gout   . Penicillins Other (See Comments) and Rash    Welts Welts Welts Welts   . Colesevelam Other (See Comments)    unknown      OBJECTIVE:  Physical Exam  There were no vitals filed for this visit. There is no height or weight on file to calculate BMI. No exam data present  General: well developed, well nourished,  very pleasant elderly Caucasian female, seated, in no evident distress Head: head normocephalic and atraumatic.   Neck: supple with no carotid or supraclavicular bruits Cardiovascular: Tachycardic irregular rate and rhythm, no murmurs Musculoskeletal: no deformity; limited bilateral shoulder ROM 2/2 pain vs ?  Arthritis Skin:  no rash/petichiae Vascular:  Normal pulses all extremities   Neurologic Exam Mental Status: Awake and fully alert.   Fluent speech and language.  Oriented to place and time.  Recent and remote memory intact. Attention span, concentration and fund of knowledge appropriate. Mood and affect appropriate.  Cranial Nerves: Pupils equal, briskly reactive to light. Extraocular movements full without nystagmus. Visual fields full to confrontation. Hearing intact. Facial sensation intact. Face, tongue, palate moves normally and symmetrically.  Motor: Normal bulk and tone. Normal strength in all tested extremity muscles. Sensory.: intact to touch , pinprick , position and vibratory sensation.  Coordination: Rapid alternating movements normal in all extremities. Finger-to-nose and heel-to-shin performed accurately bilaterally. Gait and Station: Arises from chair without difficulty. Stance is normal. Gait demonstrates normal stride length and balance without use of assistive device.  Able to tandem walk and heel toe with mild difficulty. Reflexes: 1+ and symmetric. Toes downgoing.         ASSESSMENT: Stephanie Rodgers is a 80 y.o. year old female presented with speech difficulties on 12/08/2019 with stroke work-up revealing punctate and small patchy acute left MCA and PCA infarcts, embolic secondary to new diagnosis of atrial fibrillation. Vascular risk factors include persistent atrial fibrillation, HTN, HLD and new diagnosis of moderate sleep apnea.      PLAN:  1. L MCA and PCA stroke, embolic:  a. Recovered without residual deficit b. Continue Eliquis (apixaban) daily  and Crestor for secondary stroke prevention.  c. Discussed secondary stroke prevention measures and importance of close PCP follow up for aggressive stroke risk factor management  2. Persistent atrial fibrillation, new dx:  a. On Eliquis 5 mg twice daily for CHA2DS2-VASc score of at least 5  b. S/p cardioversion 01/31/2020 c. Routinely followed by cardiology 3. At risk for sleep apnea:  a. Dx with moderate sleep apnea per HST 03/2020 and recommend initiating AutoPap -currently awaiting machine from DME  company 4. HTN: BP goal 130-150/90 to ensure adequate perfusion.  Stable on current regimen monitored by PCP 5. HLD: LDL goal <70. Continued home dose Crestor 20 mg daily per PCP     Follow-up scheduled in May for initial CPAP compliance visit.  Advised to call prior if needed with any questions or concerns   CC:  [facility] provider: Dr. Pearlean Brownie [facility]    I spent 30 minutes of face-to-face and non-face-to-face time with patient.  This included previsit chart review, lab review, study review, order entry, electronic health record documentation, patient education regarding prior stroke and etiology, new diagnosis of sleep apnea and importance of initiating CPAP, importance of managing stroke risk factors and answered all other questions to patient satisfaction   Ihor Austin, AGNP-BC  [facility] [address] [place], [place] [address]  Phone [phone] Fax [phone] Note: This document was prepared with digital dictation and possible smart phrase technology. Any transcriptional errors that result from this process are unintentional.

## 2020-09-28 ENCOUNTER — Other Ambulatory Visit (HOSPITAL_BASED_OUTPATIENT_CLINIC_OR_DEPARTMENT_OTHER): Payer: Self-pay

## 2020-09-28 ENCOUNTER — Encounter (HOSPITAL_BASED_OUTPATIENT_CLINIC_OR_DEPARTMENT_OTHER): Payer: Self-pay

## 2020-09-28 ENCOUNTER — Emergency Department (HOSPITAL_BASED_OUTPATIENT_CLINIC_OR_DEPARTMENT_OTHER): Payer: Medicare HMO

## 2020-09-28 ENCOUNTER — Emergency Department (HOSPITAL_BASED_OUTPATIENT_CLINIC_OR_DEPARTMENT_OTHER)
Admission: EM | Admit: 2020-09-28 | Discharge: 2020-09-28 | Disposition: A | Discharge status: Home / Self Care | Payer: Medicare HMO | Attending: Emergency Medicine | Admitting: Emergency Medicine

## 2020-09-28 ENCOUNTER — Other Ambulatory Visit: Payer: Self-pay

## 2020-09-28 DIAGNOSIS — R531 Weakness: Secondary | ICD-10-CM | POA: Diagnosis not present

## 2020-09-28 DIAGNOSIS — I10 Essential (primary) hypertension: Secondary | ICD-10-CM | POA: Insufficient documentation

## 2020-09-28 DIAGNOSIS — Z79899 Other long term (current) drug therapy: Secondary | ICD-10-CM | POA: Diagnosis not present

## 2020-09-28 DIAGNOSIS — Z7901 Long term (current) use of anticoagulants: Secondary | ICD-10-CM | POA: Insufficient documentation

## 2020-09-28 DIAGNOSIS — R011 Cardiac murmur, unspecified: Secondary | ICD-10-CM | POA: Insufficient documentation

## 2020-09-28 DIAGNOSIS — I4891 Unspecified atrial fibrillation: Secondary | ICD-10-CM | POA: Insufficient documentation

## 2020-09-28 DIAGNOSIS — N390 Urinary tract infection, site not specified: Secondary | ICD-10-CM | POA: Diagnosis not present

## 2020-09-28 DIAGNOSIS — R319 Hematuria, unspecified: Secondary | ICD-10-CM | POA: Diagnosis not present

## 2020-09-28 DIAGNOSIS — R42 Dizziness and giddiness: Secondary | ICD-10-CM | POA: Diagnosis not present

## 2020-09-28 DIAGNOSIS — R600 Localized edema: Secondary | ICD-10-CM | POA: Diagnosis not present

## 2020-09-28 DIAGNOSIS — R350 Frequency of micturition: Secondary | ICD-10-CM | POA: Diagnosis present

## 2020-09-28 LAB — CBC WITH DIFFERENTIAL/PLATELET
Abs Immature Granulocytes: 0.01 10*3/uL (ref 0.00–0.07)
Basophils Absolute: 0.1 10*3/uL (ref 0.0–0.1)
Basophils Relative: 1 %
Eosinophils Absolute: 0.1 10*3/uL (ref 0.0–0.5)
Eosinophils Relative: 2 %
HCT: 38.4 % (ref 36.0–46.0)
Hemoglobin: 12.9 g/dL (ref 12.0–15.0)
Immature Granulocytes: 0 %
Lymphocytes Relative: 28 %
Lymphs Abs: 1.6 10*3/uL (ref 0.7–4.0)
MCH: 30.7 pg (ref 26.0–34.0)
MCHC: 33.6 g/dL (ref 30.0–36.0)
MCV: 91.4 fL (ref 80.0–100.0)
Monocytes Absolute: 0.4 10*3/uL (ref 0.1–1.0)
Monocytes Relative: 8 %
Neutro Abs: 3.5 10*3/uL (ref 1.7–7.7)
Neutrophils Relative %: 61 %
Platelets: 212 10*3/uL (ref 150–400)
RBC: 4.2 MIL/uL (ref 3.87–5.11)
RDW: 13.8 % (ref 11.5–15.5)
WBC: 5.7 10*3/uL (ref 4.0–10.5)
nRBC: 0 % (ref 0.0–0.2)

## 2020-09-28 LAB — URINALYSIS, ROUTINE W REFLEX MICROSCOPIC
Bilirubin Urine: NEGATIVE
Glucose, UA: NEGATIVE mg/dL
Ketones, ur: NEGATIVE mg/dL
Nitrite: NEGATIVE
Protein, ur: NEGATIVE mg/dL
Specific Gravity, Urine: 1.015 (ref 1.005–1.030)
pH: 6.5 (ref 5.0–8.0)

## 2020-09-28 LAB — URINALYSIS, MICROSCOPIC (REFLEX)
RBC / HPF: >50 RBC/hpf (ref 0–5)
WBC, UA: >50 WBC/hpf (ref 0–5)

## 2020-09-28 LAB — COMPREHENSIVE METABOLIC PANEL
ALT: 10 U/L (ref 0–44)
AST: 18 U/L (ref 15–41)
Albumin: 4 g/dL (ref 3.5–5.0)
Alkaline Phosphatase: 95 U/L (ref 38–126)
Anion gap: 7 (ref 5–15)
BUN: 17 mg/dL (ref 8–23)
CO2: 28 mmol/L (ref 22–32)
Calcium: 9.2 mg/dL (ref 8.9–10.3)
Chloride: 103 mmol/L (ref 98–111)
Creatinine, Ser: 0.75 mg/dL (ref 0.44–1.00)
GFR, Estimated: >60 mL/min (ref >60)
Glucose, Bld: 119 mg/dL — ABNORMAL HIGH (ref 70–99)
Potassium: 4.2 mmol/L (ref 3.5–5.1)
Sodium: 138 mmol/L (ref 135–145)
Total Bilirubin: 0.8 mg/dL (ref 0.3–1.2)
Total Protein: 7.4 g/dL (ref 6.5–8.1)

## 2020-09-28 LAB — TROPONIN I (HIGH SENSITIVITY): Troponin I (High Sensitivity): 4 ng/L (ref <18)

## 2020-09-28 LAB — TSH: TSH: 2.808 u[IU]/mL (ref 0.350–4.500)

## 2020-09-28 IMAGING — CT CT RENAL STONE PROTOCOL
2 of 4 series · 16 of 46 positions shown, 18 images · non-contrast
Comparison: [DATE] CT abdomen/pelvis.

CLINICAL DATA: Urinary frequency.  Hematuria.

EXAM:
CT ABDOMEN AND PELVIS WITHOUT CONTRAST
TECHNIQUE: Multidetector CT imaging of the abdomen and pelvis was performed
following the standard protocol without IV contrast.

[Series 2: axial st · axial · 0.98mm/px · z∈[-487,-47]mm · 13 of 97 slices shown, 15 images]
[im 5/97  soft-tissue]
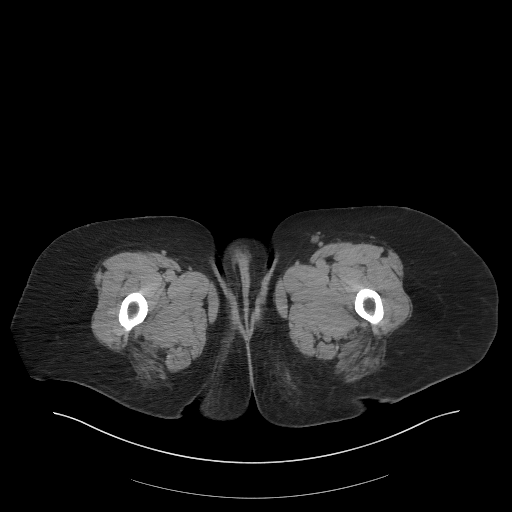
[im 5/97  bone]
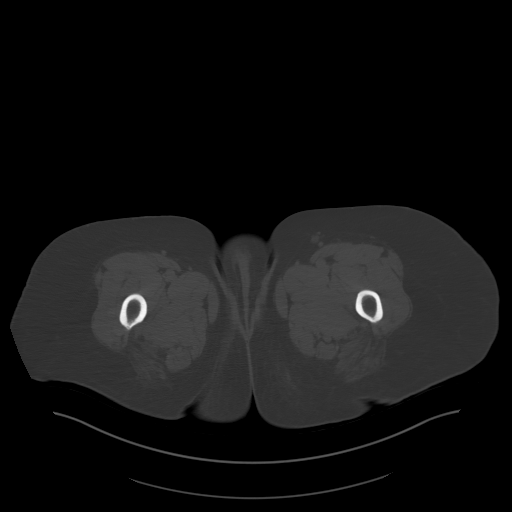
[im 13/97  soft-tissue]
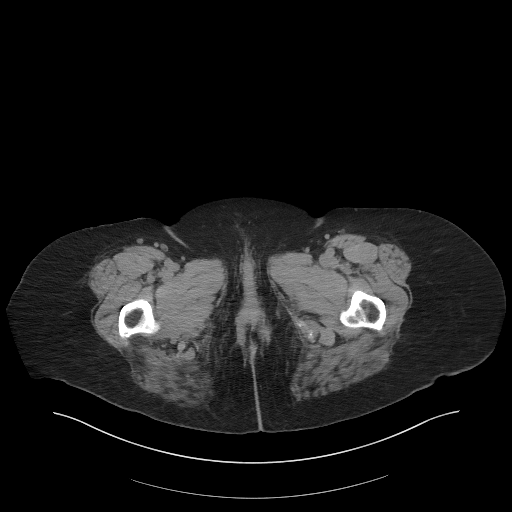
[im 21/97  soft-tissue]
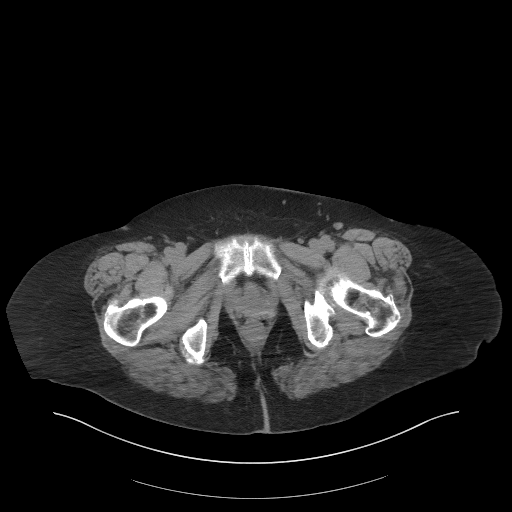
[im 29/97  soft-tissue]
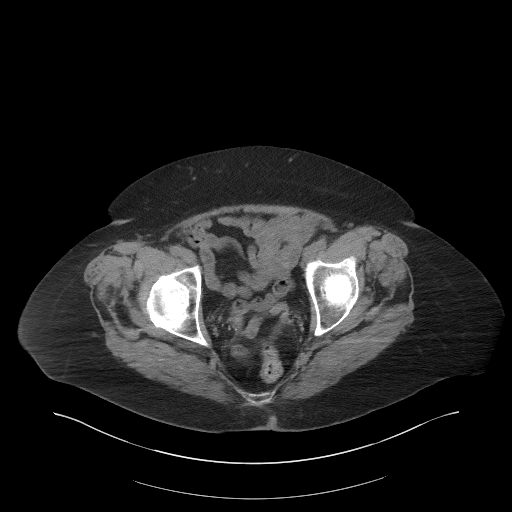
[im 33/97  soft-tissue]
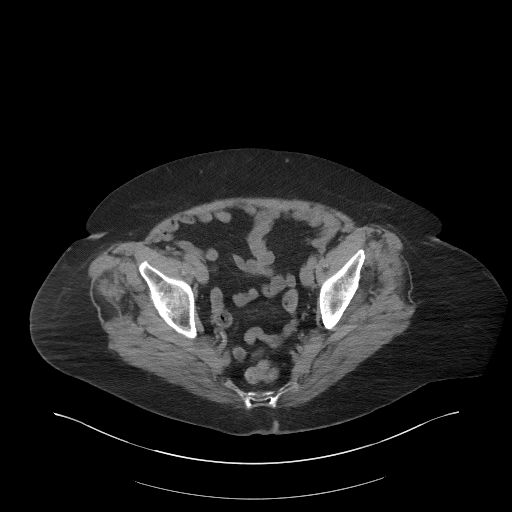
[im 41/97  soft-tissue]
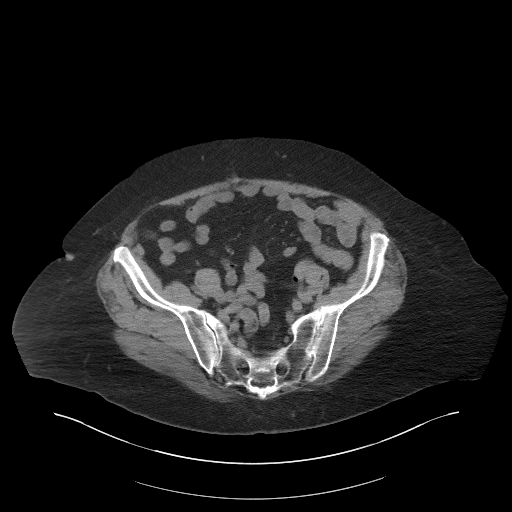
[im 49/97  soft-tissue]
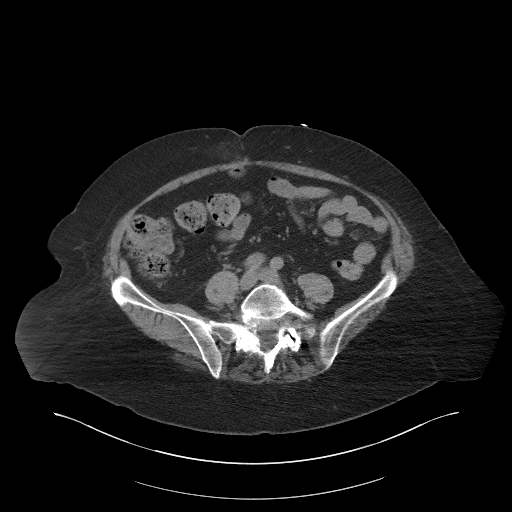
[im 57/97  soft-tissue]
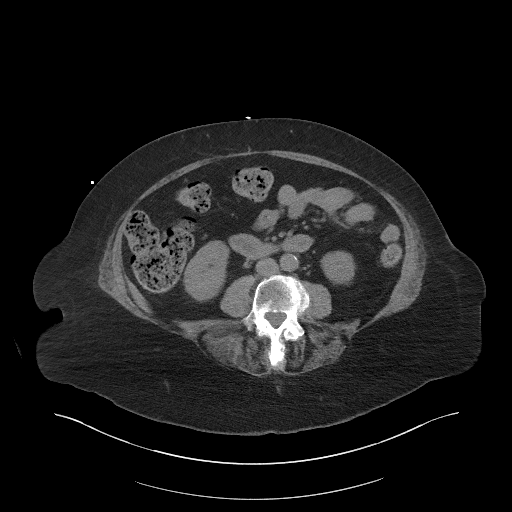
[im 65/97  soft-tissue]
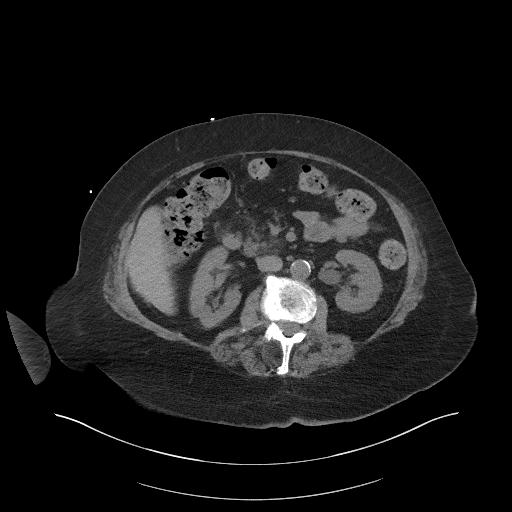
[im 65/97  bone]
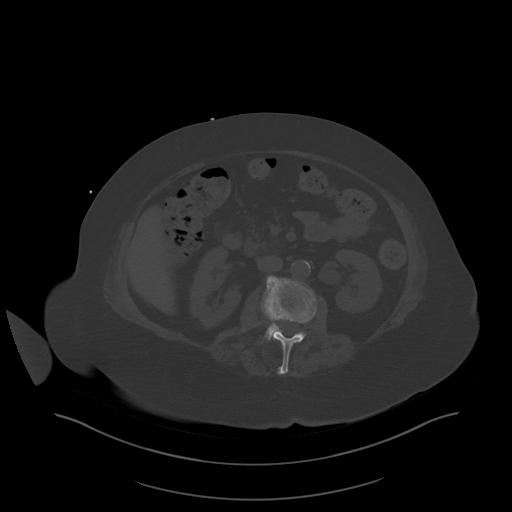
[im 69/97  soft-tissue]
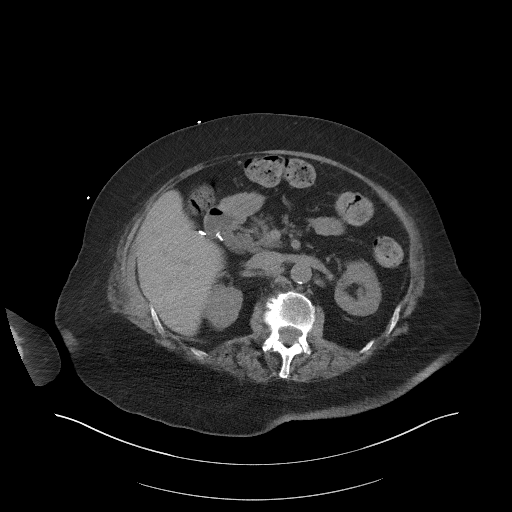
[im 77/97  soft-tissue]
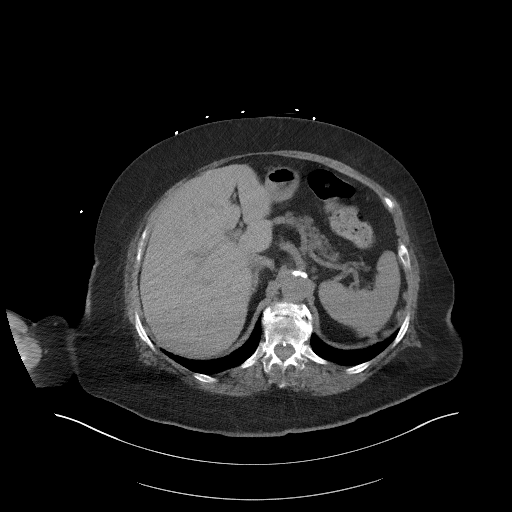
[im 85/97  soft-tissue]
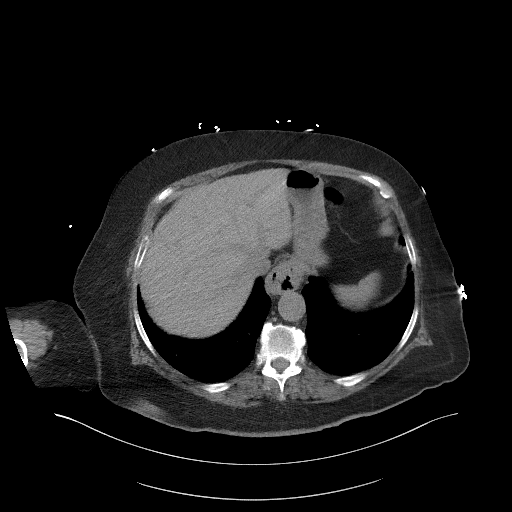
[im 93/97  soft-tissue]
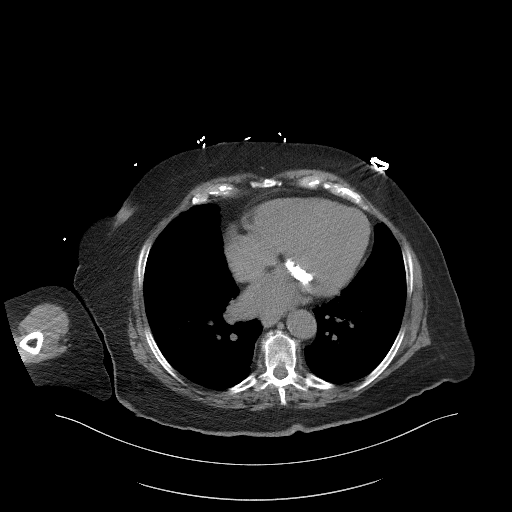

[Series 5: coronal st · coronal · 0.91mm/px · 3 of 107 slices shown]
[im 36/107  soft-tissue]
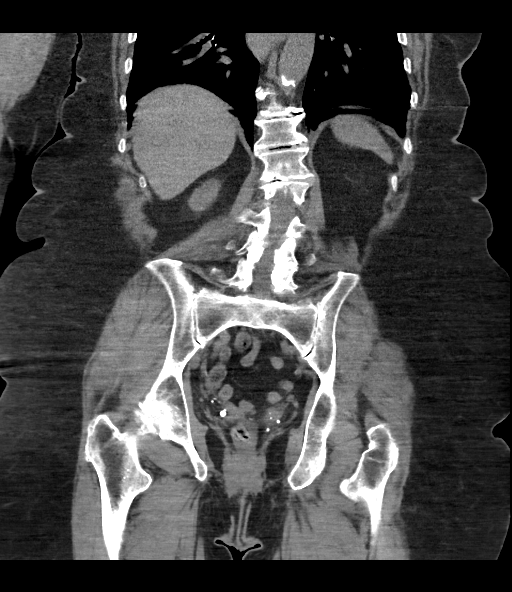
[im 48/107  soft-tissue]
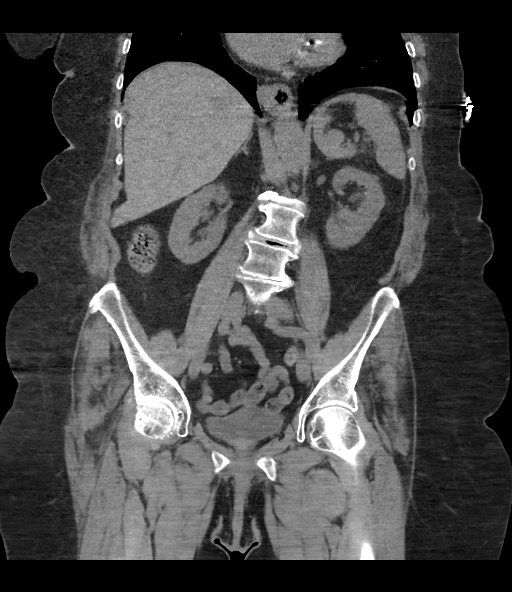
[im 59/107  soft-tissue]
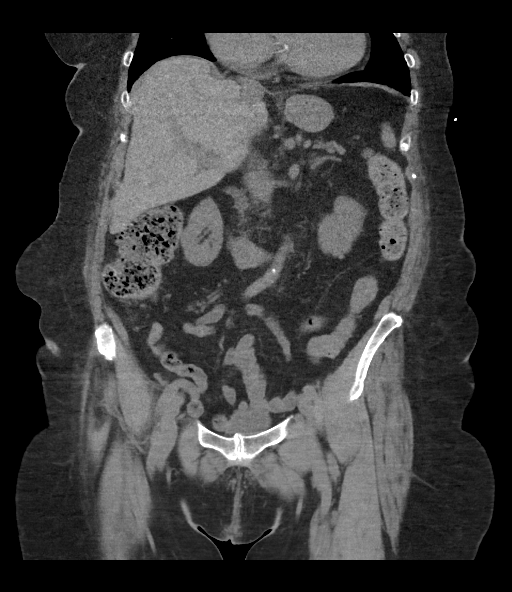

[16 of 46 positions shown; findings below may reference images not displayed]

FINDINGS: Lower chest: No significant pulmonary nodules or acute consolidative
airspace disease.

Hepatobiliary: Normal liver size. No liver mass. Cholecystectomy.
Bile ducts are stable and within normal post cholecystectomy limits
with CBD diameter 10 mm.

Pancreas: Normal, with no mass or duct dilation.

Spleen: Normal size. No mass.

Adrenals/Urinary Tract: Normal adrenals. No renal stones. No
hydronephrosis. No contour deforming renal masses. Normal caliber
ureters. No ureteral stones. Normal bladder with no bladder stones.

Stomach/Bowel: Small hiatal hernia. Otherwise normal nondistended
stomach. Normal caliber small bowel with no small bowel wall
thickening. Normal appendix. Stable postsurgical changes from
partial distal colectomy with intact appearing distal colonic
anastomosis. No large bowel wall thickening, significant
diverticulosis or acute pericolonic fat stranding.

Vascular/Lymphatic: Atherosclerotic nonaneurysmal abdominal aorta.
No pathologically enlarged lymph nodes in the abdomen or pelvis.

Reproductive: Status post hysterectomy, with no abnormal findings at
the vaginal cuff. No adnexal mass.

Other: No pneumoperitoneum, ascites or focal fluid collection.
Chronic small fat containing right periumbilical hernia.

Musculoskeletal: No aggressive appearing focal osseous lesions.
Marked thoracolumbar spondylosis.
IMPRESSION: 1. No acute abnormality. No urolithiasis. No hydronephrosis.
2. Small hiatal hernia.
3. Chronic small fat containing right periumbilical hernia.
4. Aortic Atherosclerosis ([JQ]-[JQ]).

## 2020-09-28 IMAGING — DX DG CHEST 1V PORT
1 series · 1 of 1 positions shown · non-contrast
Comparison: [DATE]

CLINICAL DATA: Weakness.

EXAM:
PORTABLE CHEST 1 VIEW

[chest ap]
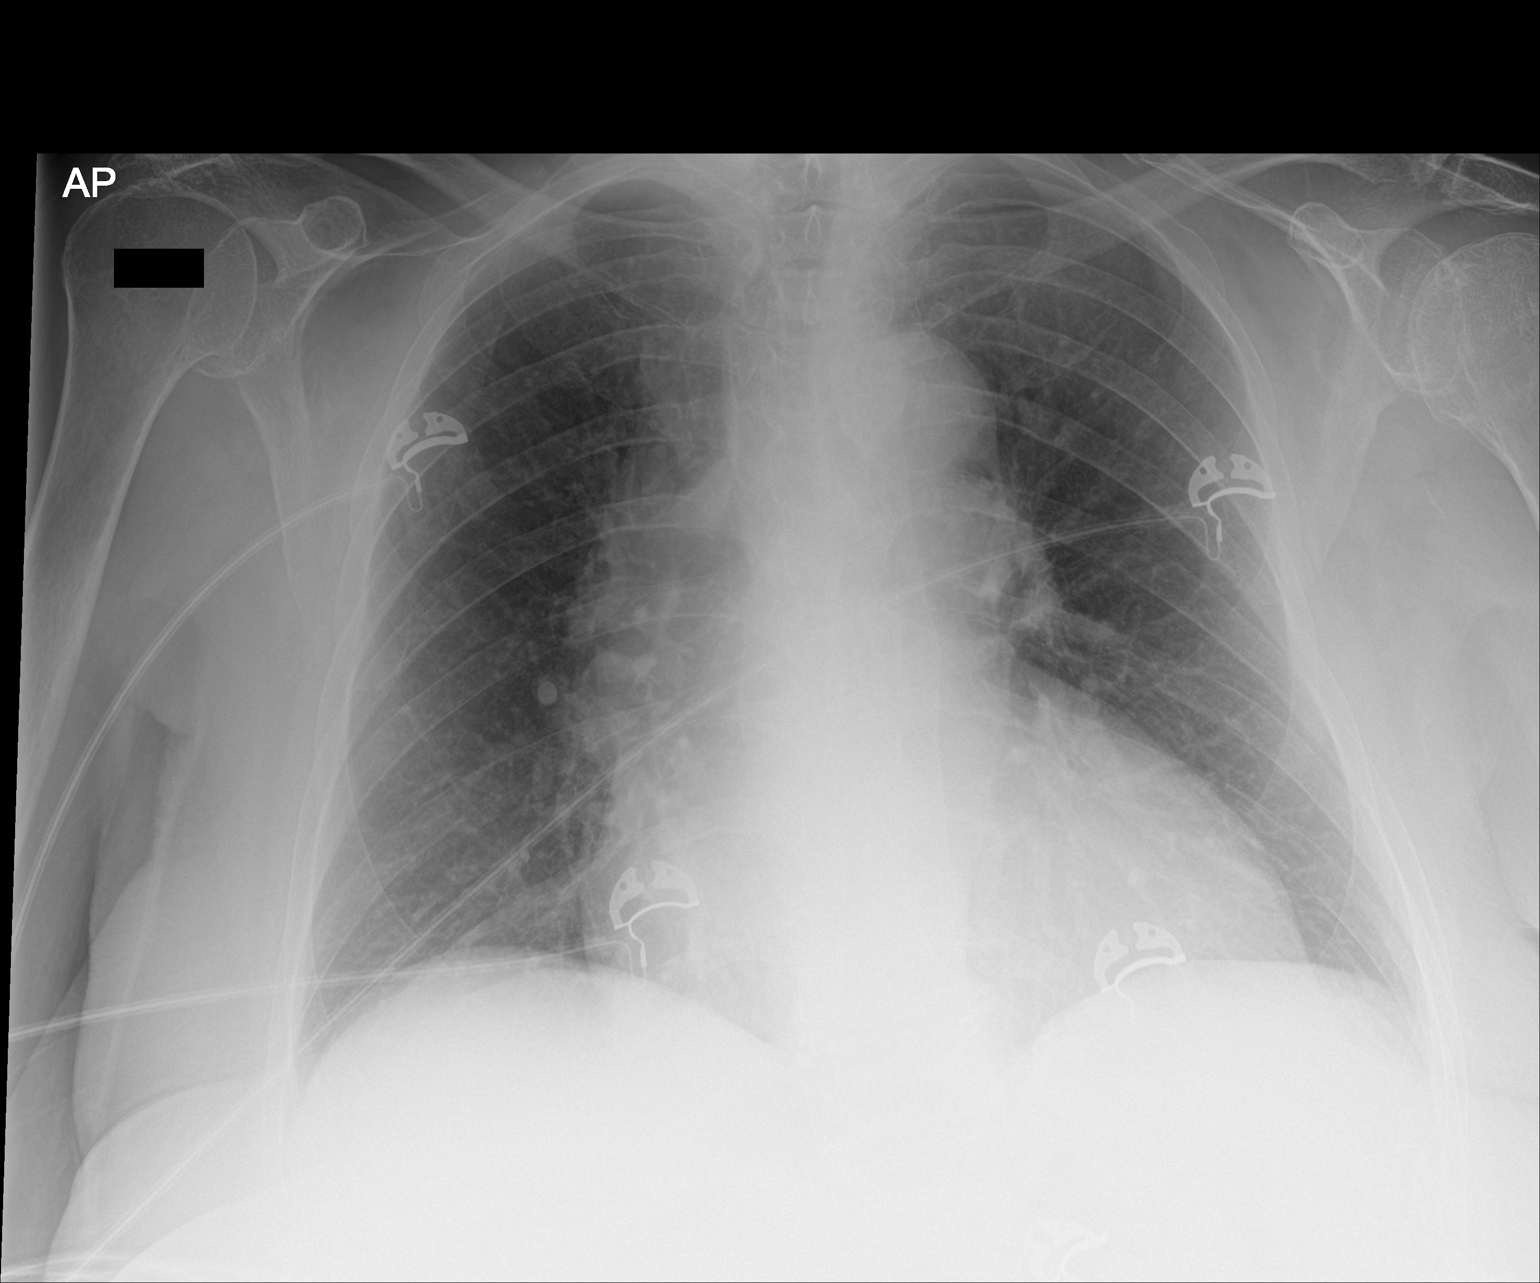

[1 of 1 positions shown; findings below may reference images not displayed]

FINDINGS: [7Z] hours. The lungs are clear without focal pneumonia, edema,
pneumothorax or pleural effusion. The cardio pericardial silhouette
is enlarged. The visualized bony structures of the thorax show no
acute abnormality. Telemetry leads overlie the chest.
IMPRESSION: No active disease.

## 2020-09-28 MED ORDER — SODIUM CHLORIDE 0.9 % IV BOLUS
500.0000 mL | Freq: Once | INTRAVENOUS | Status: AC
  Administered 2020-09-28: 500 mL via INTRAVENOUS

## 2020-09-28 MED ORDER — PHENAZOPYRIDINE HCL 200 MG PO TABS
200.0000 mg | ORAL_TABLET | Freq: Three times a day (TID) | ORAL | 0 refills | Status: DC
  Filled 2020-09-28: qty 6, 2d supply, fill #0

## 2020-09-28 MED ORDER — CEPHALEXIN 500 MG PO CAPS
500.0000 mg | ORAL_CAPSULE | Freq: Four times a day (QID) | ORAL | 0 refills | Status: DC
  Filled 2020-09-28: qty 28, 7d supply, fill #0

## 2020-09-28 NOTE — ED Triage Notes (Signed)
Pt states that on Saturday she was seen at [facility] for UTI reports she needs fluids, has been having a lot of problems r/t a new medication she is on. Was advised by UC to try and drink a lot of fluids over the weekend and then come to ED if still having symptoms. The medication is Carvedilol. She has not been taking it since Saturday, was placed on this for her BP. Pt has been having symptoms of urinary frequency, dizziness, low BP, and low HR.

## 2020-09-28 NOTE — ED Notes (Signed)
Patient transported to CT 

## 2020-09-28 NOTE — ED Notes (Signed)
C/o feeling dizzy, frequent urination, low BP and low HR.

## 2020-09-28 NOTE — ED Notes (Signed)
ED Provider at bedside. Dr. Butler. 

## 2020-09-28 NOTE — Discharge Instructions (Addendum)
You were seen in the emergency department for urinary frequency and feeling lightheaded dizzy.  Your urine showed signs of urinary tract infection.  We are putting you on antibiotics.  Drink plenty of fluids.  Follow-up with your primary care doctor.  Return to the emergency department if any worsening or concerning symptoms

## 2020-09-28 NOTE — ED Provider Notes (Signed)
[facility] EMERGENCY DEPARTMENT Provider Note   CSN: [id number] Arrival date & time: 09/28/20  1207     History Chief Complaint  Patient presents with   Fatigue    Stephanie Rodgers is a 80 y.o. female.  She has a history of A. fib and is on anticoagulation.  She was recently started on Coreg about 3 weeks ago.  She said she has had a week of urinary frequency and feeling dizzy lightheaded.  Her blood pressures been low and her heart rates been low.  She went to urgent care and they checked her for urinary tract infection.  She has not taken her Coreg in the last 2 days but does not feel any better.  She called her cardiologist today but did not receive a call back.  The history is provided by the patient.  Dizziness Quality:  Lightheadedness and imbalance Severity:  Moderate Onset quality:  Gradual Duration:  3 days Timing:  Intermittent Progression:  Unchanged Chronicity:  New Relieved by:  Nothing Worsened by:  Movement Ineffective treatments:  Fluids Associated symptoms: no blood in stool, no chest pain, no diarrhea, no headaches, no nausea, no palpitations, no shortness of breath, no vision changes, no vomiting and no weakness   Risk factors: new medications       Past Medical History:  Diagnosis Date   Atrial fibrillation (HCC)    Depression    Hyperlipidemia    Hypertension    RLS (restless legs syndrome)    Stroke [facility])     Patient Active Problem List   Diagnosis Date Noted   Atrial fibrillation with RVR (HCC) 12/11/2019   Essential hypertension 12/11/2019   Hyperlipidemia LDL goal <70 12/11/2019   Obesity 12/11/2019   Cerebral embolism with cerebral infarction (HCC) L MCA d/t new onset AF 12/08/2019    History reviewed. No pertinent surgical history.   OB History   No obstetric history on file.     No family history on file.  Social History   Tobacco Use   Smoking status: Never   Smokeless tobacco: Never  Substance Use Topics    Alcohol use: Not Currently   Drug use: Never    Home Medications Prior to Admission medications   Medication Sig Start Date End Date Taking? Authorizing Provider  apixaban (ELIQUIS) 5 MG TABS tablet Take 1 tablet (5 mg total) by mouth 2 (two) times daily. 12/11/19   Layne Benton, NP  Cholecalciferol (VITAMIN D3 PO) Take 1 tablet by mouth daily.    [provider]  clonazePAM (KLONOPIN) 0.5 MG tablet Take 0.5 mg by mouth daily as needed (sleep).  02/20/19   [provider]  Cyanocobalamin (B-12 PO) Take 1 capsule by mouth daily.    [provider]  flecainide (TAMBOCOR) 50 MG tablet Take 50 mg by mouth 2 (two) times daily.    [provider]  irbesartan (AVAPRO) 300 MG tablet Take by mouth. 03/25/20   [provider]  loratadine (CLARITIN) 10 MG tablet Take 10 mg by mouth daily as needed for allergies.    [provider]  metoprolol tartrate (LOPRESSOR) 50 MG tablet Take 1 tablet (50 mg total) by mouth 2 (two) times daily. 12/11/19   Layne Benton, NP  rosuvastatin (CRESTOR) 20 MG tablet Take 20 mg by mouth daily.    [provider]  Soft Lens Products (B & L SENSITIVE EYES SALINE) 0.4 % SOLN Place 1 drop into both eyes daily as needed (  dry eyes).    [provider]    Allergies    Alendronate, Ciprofloxacin, Hydrochlorothiazide, Penicillins, and Colesevelam  Review of Systems   Review of Systems  Constitutional:  Negative for fever.  HENT:  Negative for sore throat.   Eyes:  Negative for visual disturbance.  Respiratory:  Negative for shortness of breath.   Cardiovascular:  Negative for chest pain and palpitations.  Gastrointestinal:  Negative for abdominal pain, blood in stool, diarrhea, nausea and vomiting.  Genitourinary:  Positive for frequency. Negative for dysuria.  Musculoskeletal:  Positive for gait problem.  Skin:  Negative for rash.  Neurological:  Positive for dizziness. Negative for weakness, numbness  and headaches.   Physical Exam Updated Vital Signs BP (!) 144/60 (BP Location: Right Arm)   Pulse (!) 58   Temp 98.4 F (36.9 C) (Oral)   Resp 15   Ht 5\' 4"  (1.626 m)   Wt 93 kg   SpO2 98%   BMI 35.19 kg/m   Physical Exam Vitals and nursing note reviewed.  Constitutional:      General: She is not in acute distress.    Appearance: Normal appearance. She is well-developed.  HENT:     Head: Normocephalic and atraumatic.  Eyes:     Conjunctiva/sclera: Conjunctivae normal.  Cardiovascular:     Rate and Rhythm: Normal rate and regular rhythm.     Pulses: Normal pulses.     Heart sounds: Murmur heard.  Pulmonary:     Effort: Pulmonary effort is normal. No respiratory distress.     Breath sounds: Normal breath sounds.  Abdominal:     Palpations: Abdomen is soft.     Tenderness: There is no abdominal tenderness. There is no guarding or rebound.  Musculoskeletal:        General: No deformity or signs of injury. Normal range of motion.     Cervical back: Neck supple.     Right lower leg: Edema present.     Left lower leg: Edema present.  Skin:    General: Skin is warm and dry.     Capillary Refill: Capillary refill takes less than 2 seconds.  Neurological:     General: No focal deficit present.     Mental Status: She is alert and oriented to person, place, and time.     Cranial Nerves: No cranial nerve deficit.     Sensory: No sensory deficit.     Motor: No weakness.    ED Results / Procedures / Treatments   Labs (all labs ordered are listed, but only abnormal results are displayed) Labs Reviewed  COMPREHENSIVE METABOLIC PANEL - Abnormal; Notable for the following components:      Result Value   Glucose, Bld 119 (*)    All other components within normal limits  URINALYSIS, ROUTINE W REFLEX MICROSCOPIC - Abnormal; Notable for the following components:   APPearance CLOUDY (*)    Hgb urine dipstick LARGE (*)    Leukocytes,Ua MODERATE (*)    All other components within  normal limits  URINALYSIS, MICROSCOPIC (REFLEX) - Abnormal; Notable for the following components:   Bacteria, UA MANY (*)    All other components within normal limits  URINE CULTURE  CBC WITH DIFFERENTIAL/PLATELET  TSH  TROPONIN I (HIGH SENSITIVITY)  TROPONIN I (HIGH SENSITIVITY)    EKG EKG Interpretation  Date/Time:  Monday September 28 2020 12:51:35 EDT Ventricular Rate:  53 PR Interval:  230 QRS Duration: 102 QT Interval:  428 QTC Calculation: 402 R  Axis:   66 Text Interpretation: Sinus rhythm Atrial premature complex Prolonged PR interval rate slower and sinus compared with prior 9/21 Confirmed by Meridee ScoreButler, Makinzy Cleere [phone]([id number]) on 09/28/2020 12:55:41 PM  Radiology DG Chest Port 1 View  Result Date: 09/28/2020 CLINICAL DATA:  Weakness. EXAM: PORTABLE CHEST 1 VIEW COMPARISON:  12/08/2019 FINDINGS: 1300 hours. The lungs are clear without focal pneumonia, edema, pneumothorax or pleural effusion. The cardio pericardial silhouette is enlarged. The visualized bony structures of the thorax show no acute abnormality. Telemetry leads overlie the chest. IMPRESSION: No active disease. Electronically Signed   By: Kennith CenterEric  Mansell M.D.   On: 09/28/2020 13:25   CT Renal Stone Study  Result Date: 09/28/2020 CLINICAL DATA:  Urinary frequency.  Hematuria. EXAM: CT ABDOMEN AND PELVIS WITHOUT CONTRAST TECHNIQUE: Multidetector CT imaging of the abdomen and pelvis was performed following the standard protocol without IV contrast. COMPARISON:  12/21/2017 CT abdomen/pelvis. FINDINGS: Lower chest: No significant pulmonary nodules or acute consolidative airspace disease. Hepatobiliary: Normal liver size. No liver mass. Cholecystectomy. Bile ducts are stable and within normal post cholecystectomy limits with CBD diameter 10 mm. Pancreas: Normal, with no mass or duct dilation. Spleen: Normal size. No mass. Adrenals/Urinary Tract: Normal adrenals. No renal stones. No hydronephrosis. No contour deforming renal masses. Normal  caliber ureters. No ureteral stones. Normal bladder with no bladder stones. Stomach/Bowel: Small hiatal hernia. Otherwise normal nondistended stomach. Normal caliber small bowel with no small bowel wall thickening. Normal appendix. Stable postsurgical changes from partial distal colectomy with intact appearing distal colonic anastomosis. No large bowel wall thickening, significant diverticulosis or acute pericolonic fat stranding. Vascular/Lymphatic: Atherosclerotic nonaneurysmal abdominal aorta. No pathologically enlarged lymph nodes in the abdomen or pelvis. Reproductive: Status post hysterectomy, with no abnormal findings at the vaginal cuff. No adnexal mass. Other: No pneumoperitoneum, ascites or focal fluid collection. Chronic small fat containing right periumbilical hernia. Musculoskeletal: No aggressive appearing focal osseous lesions. Marked thoracolumbar spondylosis. IMPRESSION: 1. No acute abnormality. No urolithiasis. No hydronephrosis. 2. Small hiatal hernia. 3. Chronic small fat containing right periumbilical hernia. 4. Aortic Atherosclerosis (ICD10-I70.0). Electronically Signed   By: Delbert PhenixJason A Poff M.D.   On: 09/28/2020 14:36    Procedures Procedures   Medications Ordered in ED Medications  sodium chloride 0.9 % bolus 500 mL (has no administration in time range)    ED Course  I have reviewed the triage vital signs and the nursing notes.  Pertinent labs & imaging results that were available during my care of the patient were reviewed by me and considered in my medical decision making (see chart for details).  Clinical Course as of 09/28/20 1803  Mon Sep 28, 2020  1305 Chest x-ray interpreted by me as no acute infiltrates. [MB]    Clinical Course User Index [MB] Terrilee FilesButler, Siyana Erney C, MD   MDM Rules/Calculators/A&P                         This patient complains of urinary frequency, lightheadedness fatigue; this involves an extensive number of treatment Options and is a complaint that  carries with it a high risk of complications and Morbidity. The differential includes UTI, anemia, arrhythmia, metabolic derangement, hypothyroidism, ACS  I ordered, reviewed and interpreted labs, which included CBC with normal white count normal hemoglobin, chemistries fairly normal urinalysis with signs of infection greater than 50 reds greater than 50 whites, TSH normal I ordered medication IV fluids with improvement in her symptoms I ordered imaging studies which included chest  x-ray and CT renal stone study and I independently    visualized and interpreted imaging which showed no acute findings Previous records obtained and reviewed in epic including prior urgent care visit  After the interventions stated above, I reevaluated the patient and found patient to be symptomatically improved.  Reviewed work-up with her.  She is comfortable plan for antibiotics and Pyridium.  Recommended close follow-up with PCP.  Return instructions discussed   Final Clinical Impression(s) / ED Diagnoses Final diagnoses:  Lower urinary tract infectious disease  Weakness    Rx / DC Orders ED Discharge Orders          Ordered    phenazopyridine (PYRIDIUM) 200 MG tablet  3 times daily        09/28/20 1518    cephALEXin (KEFLEX) 500 MG capsule  4 times daily        09/28/20 1518             Terrilee Files, MD 09/28/20 1805

## 2020-10-01 LAB — URINE CULTURE: Culture: >=100000 — AB

## 2020-10-02 ENCOUNTER — Telehealth: Payer: Self-pay | Admitting: Emergency Medicine

## 2020-10-02 NOTE — Telephone Encounter (Signed)
Post ED Visit - Positive Culture Follow-up  Culture report reviewed by antimicrobial stewardship pharmacist: [facility] []  , Pharm.D. []  Enzo Bi, Pharm.D., BCPS AQ-ID []  , Pharm.D., BCPS []  Celedonio Miyamoto, Pharm.D., BCPS []  [place], Garvin Fila.D., BCPS, AAHIVP []  , Pharm.D., BCPS, AAHIVP [x]  Georgina Pillion, PharmD, BCPS []  , PharmD, BCPS []  [place], PharmD, BCPS []  [address], PharmD []  , PharmD, BCPS []  Estella Husk, PharmD  Pharmacy Team []  Lysle Pearl, PharmD []  , PharmD []  Phillips Climes, PharmD []  , Rph []  Agapito Games) , PharmD []  Verlan Friends, PharmD []  , PharmD []  Mervyn Gay, PharmD []  , PharmD []  Vinnie Level, PharmD []  [facility], PharmD []  , PharmD []  Len Childs, PharmD   Positive urine culture Treated with Cephalexin, organism sensitive to the same and no further patient follow-up is required at this time.  Sayler Mickiewicz 10/02/2020, 1:02 PM

## 2021-01-25 ENCOUNTER — Emergency Department (HOSPITAL_COMMUNITY)
Admission: EM | Admit: 2021-01-25 | Discharge: 2021-01-25 | Disposition: A | Discharge status: Home / Self Care | Payer: Medicare HMO | Attending: Emergency Medicine | Admitting: Emergency Medicine

## 2021-01-25 ENCOUNTER — Encounter (HOSPITAL_COMMUNITY): Payer: Self-pay

## 2021-01-25 ENCOUNTER — Other Ambulatory Visit: Payer: Self-pay

## 2021-01-25 DIAGNOSIS — I10 Essential (primary) hypertension: Secondary | ICD-10-CM | POA: Diagnosis not present

## 2021-01-25 DIAGNOSIS — U071 COVID-19: Secondary | ICD-10-CM | POA: Diagnosis not present

## 2021-01-25 DIAGNOSIS — I4891 Unspecified atrial fibrillation: Secondary | ICD-10-CM | POA: Insufficient documentation

## 2021-01-25 DIAGNOSIS — Z7901 Long term (current) use of anticoagulants: Secondary | ICD-10-CM | POA: Insufficient documentation

## 2021-01-25 DIAGNOSIS — Z79899 Other long term (current) drug therapy: Secondary | ICD-10-CM | POA: Insufficient documentation

## 2021-01-25 DIAGNOSIS — R531 Weakness: Secondary | ICD-10-CM | POA: Diagnosis present

## 2021-01-25 LAB — COMPREHENSIVE METABOLIC PANEL
ALT: 13 U/L (ref 0–44)
AST: 22 U/L (ref 15–41)
Albumin: 3.9 g/dL (ref 3.5–5.0)
Alkaline Phosphatase: 93 U/L (ref 38–126)
Anion gap: 10 (ref 5–15)
BUN: 14 mg/dL (ref 8–23)
CO2: 23 mmol/L (ref 22–32)
Calcium: 9.2 mg/dL (ref 8.9–10.3)
Chloride: 106 mmol/L (ref 98–111)
Creatinine, Ser: 1 mg/dL (ref 0.44–1.00)
GFR, Estimated: 57 mL/min — ABNORMAL LOW (ref >60)
Glucose, Bld: 100 mg/dL — ABNORMAL HIGH (ref 70–99)
Potassium: 4.7 mmol/L (ref 3.5–5.1)
Sodium: 139 mmol/L (ref 135–145)
Total Bilirubin: 1.3 mg/dL — ABNORMAL HIGH (ref 0.3–1.2)
Total Protein: 7.3 g/dL (ref 6.5–8.1)

## 2021-01-25 LAB — CBC
HCT: 42.4 % (ref 36.0–46.0)
Hemoglobin: 13.8 g/dL (ref 12.0–15.0)
MCH: 30 pg (ref 26.0–34.0)
MCHC: 32.5 g/dL (ref 30.0–36.0)
MCV: 92.2 fL (ref 80.0–100.0)
Platelets: 193 10*3/uL (ref 150–400)
RBC: 4.6 MIL/uL (ref 3.87–5.11)
RDW: 15.3 % (ref 11.5–15.5)
WBC: 6.6 10*3/uL (ref 4.0–10.5)
nRBC: 0 % (ref 0.0–0.2)

## 2021-01-25 LAB — RESP PANEL BY RT-PCR (FLU A&B, COVID) ARPGX2
Influenza A by PCR: NEGATIVE
Influenza B by PCR: NEGATIVE
SARS Coronavirus 2 by RT PCR: POSITIVE — AB

## 2021-01-25 LAB — TROPONIN I (HIGH SENSITIVITY): Troponin I (High Sensitivity): 8 ng/L (ref <18)

## 2021-01-25 MED ORDER — ACETAMINOPHEN 500 MG PO TABS
1000.0000 mg | ORAL_TABLET | Freq: Once | ORAL | Status: AC
  Administered 2021-01-25: 1000 mg via ORAL
  Filled 2021-01-25: qty 2

## 2021-01-25 MED ORDER — MOLNUPIRAVIR EUA 200MG CAPSULE: 4.0000 | ORAL_CAPSULE | Freq: Two times a day (BID) | ORAL | 0 refills | Status: AC

## 2021-01-25 NOTE — ED Triage Notes (Signed)
Pt BIB [facility] from home d/t feeling weak while on the toilet & reports fever yesterday. Pt states she was around her son who had a virus (per pt) & her husband had been sick as well. EMS reports pt was A&Ox4 & having cold sweats in a cool environment upon their arrival. BP 90/54 & CBG 162. Bilateral 20g PIV inserted & she received 150cc NS while in route & her last reported BP was 118/60. & 68 bpm, 95% O2 on RA.

## 2021-01-25 NOTE — ED Provider Notes (Signed)
[place] EMERGENCY DEPARTMENT Provider Note   CSN: [id number] Arrival date & time: 01/25/21  1423     History Chief Complaint  Patient presents with   Weakness        Hypotension    Stephanie Rodgers is a 80 y.o. female.  Patient c/o fever in past day, and states felt generally weak at home. Symptoms acute onset yesterday, moderate, constant, persistent. Got lightheaded when stood today. No room spinning or imbalance. No trauma or fall. No syncope. Denies headache. No neck pain or stiffness. No chest pain or discomfort. No sob or unusual doe. Decreased appetite in past two days. No vomiting or diarrhea, having normal bms. No dysuria or gu c/o. No extremity pain, swelling, redness or rash. Family member w recent fever/uri symptoms - no specific diagnosis then, felt likely was viral uri. No recent change in meds or new meds. Compliant w home meds. No recent abnormal bruising or bleeding, no melena or rectal bleeding.   The history is provided by the patient, a relative, medical records and the EMS personnel.  Weakness Associated symptoms: fever   Associated symptoms: no abdominal pain, no chest pain, no cough, no diarrhea, no dysuria, no headaches, no shortness of breath and no vomiting       Past Medical History:  Diagnosis Date   Atrial fibrillation ([place])    Depression    Hyperlipidemia    Hypertension    RLS (restless legs syndrome)    Stroke [facility])     Patient Active Problem List   Diagnosis Date Noted   Atrial fibrillation with RVR ([place]) 12/11/2019   Essential hypertension 12/11/2019   Hyperlipidemia LDL goal <70 12/11/2019   Obesity 12/11/2019   Cerebral embolism with cerebral infarction ([place]) L MCA d/t new onset AF 12/08/2019    History reviewed. No pertinent surgical history.   OB History   No obstetric history on file.     History reviewed. No pertinent family history.  Social History   Tobacco Use   Smoking status: Never   Smokeless  tobacco: Never  Substance Use Topics   Alcohol use: Not Currently   Drug use: Never    Home Medications Prior to Admission medications   Medication Sig Start Date End Date Taking? Authorizing Provider  acetaminophen (TYLENOL) 500 MG tablet Take 1,000 mg by mouth every 6 (six) hours as needed for mild pain, fever or headache.   Yes [provider]  apixaban (ELIQUIS) 5 MG TABS tablet Take 1 tablet (5 mg total) by mouth 2 (two) times daily. 12/11/19  Yes Donzetta Starch, NP  carvedilol (COREG) 6.25 MG tablet Take 6.25 mg by mouth 2 (two) times daily as needed (Bp @ 140+).   Yes [provider]  Cholecalciferol (VITAMIN D3 PO) Take 1 tablet by mouth daily.   Yes [provider]  clonazePAM (KLONOPIN) 0.5 MG tablet Take 0.5 mg by mouth at bedtime. 02/20/19  Yes [provider]  flecainide (TAMBOCOR) 50 MG tablet Take 50 mg by mouth 2 (two) times daily.   Yes [provider]  irbesartan (AVAPRO) 300 MG tablet Take 300 mg by mouth daily. 03/25/20  Yes [provider]  loratadine-pseudoephedrine (CLARITIN-D 24-HOUR) 10-240 MG 24 hr tablet Take 1 tablet by mouth daily as needed for allergies.   Yes [provider]  rosuvastatin (CRESTOR) 40 MG tablet Take 40 mg by mouth daily. 12/24/20  Yes [provider]  sodium chloride (MURO 128) 2 % ophthalmic  solution Place 1 drop into both eyes every 3 (three) hours as needed for eye irritation.   Yes [provider]  cephALEXin (KEFLEX) 500 MG capsule Take 1 capsule (500 mg total) by mouth 4 (four) times daily. Patient not taking: Reported on 01/25/2021 09/28/20   Terrilee Files, MD  metoprolol tartrate (LOPRESSOR) 50 MG tablet Take 1 tablet (50 mg total) by mouth 2 (two) times daily. Patient not taking: Reported on 01/25/2021 12/11/19   Layne Benton, NP  phenazopyridine (PYRIDIUM) 200 MG tablet Take 1 tablet (200 mg total) by mouth 3 (three) times daily. Patient not taking: Reported  on 01/25/2021 09/28/20   Terrilee Files, MD    Allergies    Alendronate, Ciprofloxacin, Hydrochlorothiazide, Penicillins, and Colesevelam  Review of Systems   Review of Systems  Constitutional:  Positive for fever.  HENT:  Negative for sinus pain and sore throat.   Eyes:  Negative for pain, redness and visual disturbance.  Respiratory:  Negative for cough and shortness of breath.   Cardiovascular:  Negative for chest pain.  Gastrointestinal:  Negative for abdominal pain, diarrhea and vomiting.  Genitourinary:  Negative for dysuria and flank pain.  Musculoskeletal:  Negative for back pain, neck pain and neck stiffness.  Skin:  Negative for rash.  Neurological:  Positive for weakness. Negative for headaches.  Hematological:  Does not bruise/bleed easily.  Psychiatric/Behavioral:  Negative for confusion.    Physical Exam Updated Vital Signs BP 121/60   Pulse 65   Temp 98 F (36.7 C) (Oral)   Resp 18   SpO2 100%   Physical Exam Vitals and nursing note reviewed.  Constitutional:      Appearance: Normal appearance. She is well-developed.  HENT:     Head: Atraumatic.     Nose: Nose normal. No congestion.     Mouth/Throat:     Mouth: Mucous membranes are moist.     Pharynx: Oropharynx is clear. No oropharyngeal exudate or posterior oropharyngeal erythema.  Eyes:     General: No scleral icterus.    Conjunctiva/sclera: Conjunctivae normal.     Pupils: Pupils are equal, round, and reactive to light.  Neck:     Trachea: No tracheal deviation.     Comments: No stiffness or rigidity.  Cardiovascular:     Rate and Rhythm: Normal rate and regular rhythm.     Pulses: Normal pulses.     Heart sounds: Normal heart sounds. No murmur heard.   No friction rub. No gallop.  Pulmonary:     Effort: Pulmonary effort is normal. No respiratory distress.     Breath sounds: Normal breath sounds.  Abdominal:     General: Bowel sounds are normal. There is no distension.     Palpations:  Abdomen is soft.     Tenderness: There is no abdominal tenderness. There is no guarding.  Genitourinary:    Comments: No cva tenderness.  Musculoskeletal:        General: No swelling or tenderness.     Cervical back: Normal range of motion and neck supple. No rigidity. No muscular tenderness.     Comments: Good rom bil ext without pain or focal bony tenderness. No swelling or redness noted.   Lymphadenopathy:     Cervical: No cervical adenopathy.  Skin:    General: Skin is warm and dry.     Findings: No rash.  Neurological:     Mental Status: She is alert.     Comments: Alert, speech normal. Motor/sens  grossly intact bil.   Psychiatric:        Mood and Affect: Mood normal.    ED Results / Procedures / Treatments   Labs (all labs ordered are listed, but only abnormal results are displayed) Results for orders placed or performed during the hospital encounter of 01/25/21  Resp Panel by RT-PCR (Flu A&B, Covid) Nasopharyngeal Swab   Specimen: Nasopharyngeal Swab; Nasopharyngeal(NP) swabs in vial transport medium  Result Value Ref Range   SARS Coronavirus 2 by RT PCR POSITIVE (A) NEGATIVE   Influenza A by PCR NEGATIVE NEGATIVE   Influenza B by PCR NEGATIVE NEGATIVE  CBC  Result Value Ref Range   WBC 6.6 4.0 - 10.5 K/uL   RBC 4.60 3.87 - 5.11 MIL/uL   Hemoglobin 13.8 12.0 - 15.0 g/dL   HCT 42.4 36.0 - 46.0 %   MCV 92.2 80.0 - 100.0 fL   MCH 30.0 26.0 - 34.0 pg   MCHC 32.5 30.0 - 36.0 g/dL   RDW 15.3 11.5 - 15.5 %   Platelets 193 150 - 400 K/uL   nRBC 0.0 0.0 - 0.2 %  Comprehensive metabolic panel  Result Value Ref Range   Sodium 139 135 - 145 mmol/L   Potassium 4.7 3.5 - 5.1 mmol/L   Chloride 106 98 - 111 mmol/L   CO2 23 22 - 32 mmol/L   Glucose, Bld 100 (H) 70 - 99 mg/dL   BUN 14 8 - 23 mg/dL   Creatinine, Ser 1.00 0.44 - 1.00 mg/dL   Calcium 9.2 8.9 - 10.3 mg/dL   Total Protein 7.3 6.5 - 8.1 g/dL   Albumin 3.9 3.5 - 5.0 g/dL   AST 22 15 - 41 U/L   ALT 13 0 - 44 U/L    Alkaline Phosphatase 93 38 - 126 U/L   Total Bilirubin 1.3 (H) 0.3 - 1.2 mg/dL   GFR, Estimated 57 (L) >60 mL/min   Anion gap 10 5 - 15  Troponin I (High Sensitivity)  Result Value Ref Range   Troponin I (High Sensitivity) 8 <18 ng/L      EKG EKG Interpretation  Date/Time:  Monday January 25 2021 14:25:15 EST Ventricular Rate:  65 PR Interval:  220 QRS Duration: 105 QT Interval:  413 QTC Calculation: 430 R Axis:   77 Text Interpretation: Sinus rhythm Atrial premature complexes Prolonged PR interval Confirmed by Lajean Saver [phone]) on 01/25/2021 3:16:13 PM  Radiology No results found.  Procedures Procedures   Medications Ordered in ED Medications - No data to display  ED Course  I have reviewed the triage vital signs and the nursing notes.  Pertinent labs & imaging results that were available during my care of the patient were reviewed by me and considered in my medical decision making (see chart for details).    MDM Rules/Calculators/A&P                           Iv ns. Labs. Imaging.   Reviewed nursing notes and prior charts for additional history.   Labs reviewed/interpreted by me - trop neg - after symptoms for 2 days, felt not c/w acs. Covid test is positive - discussed w pt - pt reports having had 2  vaccinations and booster. Pt is interested in oral med tx.   Po fluids, food, acetaminophen po. No nv. Abd soft nt. Breathing comfortably, room air sats 98%.   Pt currently appears stable for d/c.   Return  precautions provided.      Final Clinical Impression(s) / ED Diagnoses Final diagnoses:  None    Rx / DC Orders ED Discharge Orders     None        Lajean Saver, MD 01/25/21 1905

## 2021-01-25 NOTE — Discharge Instructions (Addendum)
It was our pleasure to provide your ER care today - we hope that you feel better.  Drink plenty of fluids/stay adequately hydrated. Get adequate nutrition. Stay active, take full and deep breaths. Take acetaminophen as need. Take medication as prescribed.   Return to ER if worse, difficulty  breathing, or other concern.

## 2021-10-16 ENCOUNTER — Emergency Department (HOSPITAL_BASED_OUTPATIENT_CLINIC_OR_DEPARTMENT_OTHER)
Admission: EM | Admit: 2021-10-16 | Discharge: 2021-10-16 | Disposition: A | Discharge status: Home / Self Care | Payer: Medicare HMO | Attending: Emergency Medicine | Admitting: Emergency Medicine

## 2021-10-16 ENCOUNTER — Encounter (HOSPITAL_BASED_OUTPATIENT_CLINIC_OR_DEPARTMENT_OTHER): Payer: Self-pay | Admitting: Emergency Medicine

## 2021-10-16 DIAGNOSIS — T148XXA Other injury of unspecified body region, initial encounter: Secondary | ICD-10-CM

## 2021-10-16 DIAGNOSIS — Z79899 Other long term (current) drug therapy: Secondary | ICD-10-CM | POA: Diagnosis not present

## 2021-10-16 DIAGNOSIS — L7622 Postprocedural hemorrhage and hematoma of skin and subcutaneous tissue following other procedure: Secondary | ICD-10-CM | POA: Insufficient documentation

## 2021-10-16 DIAGNOSIS — Z7901 Long term (current) use of anticoagulants: Secondary | ICD-10-CM | POA: Insufficient documentation

## 2021-10-16 NOTE — Discharge Instructions (Addendum)
Return with any worsening symptoms.  Please take your Eliquis as prescribed.  It was a pleasure to meet you and I hope that you feel better

## 2021-10-16 NOTE — ED Triage Notes (Signed)
Pt seen by dermatologist had a large "wart" cut off Thursday. Friday started bleeding has been bleeding since,worse today. Pt takes eliquis.

## 2021-10-16 NOTE — ED Provider Notes (Signed)
[facility] EMERGENCY DEPARTMENT Provider Note   CSN: [id number] Arrival date & time: 10/16/21  1951     History  Chief Complaint  Patient presents with   Wound Check    Stephanie Rodgers is a 81 y.o. female with a past medical history of A-fib on Eliquis presenting today with complaint of bleeding to the right lower extremity.  She reports that on Thursday she had a mole removed by her dermatologist.  She went to sleep without abnormalities on Thursday night but she woke up on Friday and it bled and noticed all day.  She woke up today and it was bleeding even more.  Has not missed any doses of her Eliquis.  Denies any dizziness, palpitations, shortness of breath or presyncopal feelings.   Wound Check       Home Medications Prior to Admission medications   Medication Sig Start Date End Date Taking? Authorizing Provider  acetaminophen (TYLENOL) 500 MG tablet Take 1,000 mg by mouth every 6 (six) hours as needed for mild pain, fever or headache.    [provider]  apixaban (ELIQUIS) 5 MG TABS tablet Take 1 tablet (5 mg total) by mouth 2 (two) times daily. 12/11/19   Layne Benton, NP  carvedilol (COREG) 6.25 MG tablet Take 6.25 mg by mouth 2 (two) times daily as needed (Bp @ 140+).    [provider]  cephALEXin (KEFLEX) 500 MG capsule Take 1 capsule (500 mg total) by mouth 4 (four) times daily. Patient not taking: Reported on 01/25/2021 09/28/20   Terrilee Files, MD  Cholecalciferol (VITAMIN D3 PO) Take 1 tablet by mouth daily.    [provider]  clonazePAM (KLONOPIN) 0.5 MG tablet Take 0.5 mg by mouth at bedtime. 02/20/19   [provider]  flecainide (TAMBOCOR) 50 MG tablet Take 50 mg by mouth 2 (two) times daily.    [provider]  irbesartan (AVAPRO) 300 MG tablet Take 300 mg by mouth daily. 03/25/20   [provider]  loratadine-pseudoephedrine (CLARITIN-D 24-HOUR) 10-240 MG 24 hr tablet Take 1 tablet by mouth daily  as needed for allergies.    [provider]  metoprolol tartrate (LOPRESSOR) 50 MG tablet Take 1 tablet (50 mg total) by mouth 2 (two) times daily. Patient not taking: Reported on 01/25/2021 12/11/19   Layne Benton, NP  phenazopyridine (PYRIDIUM) 200 MG tablet Take 1 tablet (200 mg total) by mouth 3 (three) times daily. Patient not taking: Reported on 01/25/2021 09/28/20   Terrilee Files, MD  rosuvastatin (CRESTOR) 40 MG tablet Take 40 mg by mouth daily. 12/24/20   [provider]  sodium chloride (MURO 128) 2 % ophthalmic solution Place 1 drop into both eyes every 3 (three) hours as needed for eye irritation.    [provider]      Allergies    Alendronate, Ciprofloxacin, Hydrochlorothiazide, Penicillins, and Colesevelam    Review of Systems   Review of Systems  Physical Exam Updated Vital Signs BP (!) 134/112 (BP Location: Left Arm)   Pulse (!) 58   Temp 98.2 F (36.8 C) (Oral)   Resp 18   Ht 5\' 4"  (1.626 m)   Wt 81.7 kg   SpO2 96%   BMI 30.91 kg/m  Physical Exam Vitals and nursing note reviewed.  Constitutional:      Appearance: Normal appearance.  HENT:     Head: Normocephalic and atraumatic.  Eyes:     General: No scleral icterus.  Conjunctiva/sclera: Conjunctivae normal.  Pulmonary:     Effort: Pulmonary effort is normal. No respiratory distress.  Skin:    Findings: No rash.     Comments: Pinpoint area of the right anterior thigh oozing blood.  Nonpulsatile  Neurological:     Mental Status: She is alert.  Psychiatric:        Mood and Affect: Mood normal.     ED Results / Procedures / Treatments   Labs (all labs ordered are listed, but only abnormal results are displayed) Labs Reviewed - No data to display  EKG None  Radiology No results found.  Procedures Procedures   Medications Ordered in ED Medications - No data to display  ED Course/ Medical Decision Making/ A&P                           Medical Decision  Making  81 year old patient with prolonged bleeding after her dermatologic procedure.  On Eliquis.  Physical exam revealing of a small oozing area to the anterior right thigh.  Appears to be venous blood, patient is with many varicose veins.  Nonpulsatile.  Treatment: Quick clot was placed in the wound and pressure was held.  After 45 minutes the area was rechecked and was no longer bleeding.  I rewrapped the area with Coban and gauze and patient is agreeable to discharge at this time.  She was instructed to continue to take her Eliquis.  She and her son are in agreement with the plan.  No signs or symptoms of profuse anemia, doubt significant blood loss.  No labs were ordered.  Return precautions were discussed prior to discharge.   Final Clinical Impression(s) / ED Diagnoses Final diagnoses:  Bleeding from wound    Rx / DC Orders ED Discharge Orders     None      Results and diagnoses were explained to the patient. Return precautions discussed in full. Patient had no additional questions and expressed complete understanding.   This chart was dictated using voice recognition software.  Despite best efforts to proofread,  errors can occur which can change the documentation meaning.    Saddie Benders, PA-C 10/16/21 2120    Tanda Rockers A, DO 10/24/21 [id number]

## 2022-01-15 ENCOUNTER — Encounter (HOSPITAL_BASED_OUTPATIENT_CLINIC_OR_DEPARTMENT_OTHER): Payer: Self-pay | Admitting: Emergency Medicine

## 2022-01-15 ENCOUNTER — Other Ambulatory Visit: Payer: Self-pay

## 2022-01-15 ENCOUNTER — Emergency Department (HOSPITAL_BASED_OUTPATIENT_CLINIC_OR_DEPARTMENT_OTHER)
Admission: EM | Admit: 2022-01-15 | Discharge: 2022-01-15 | Disposition: A | Discharge status: Home / Self Care | Payer: Medicare HMO | Attending: Emergency Medicine | Admitting: Emergency Medicine

## 2022-01-15 DIAGNOSIS — Z7901 Long term (current) use of anticoagulants: Secondary | ICD-10-CM | POA: Insufficient documentation

## 2022-01-15 DIAGNOSIS — Z79899 Other long term (current) drug therapy: Secondary | ICD-10-CM | POA: Diagnosis not present

## 2022-01-15 DIAGNOSIS — R040 Epistaxis: Secondary | ICD-10-CM | POA: Diagnosis present

## 2022-01-15 DIAGNOSIS — I10 Essential (primary) hypertension: Secondary | ICD-10-CM | POA: Diagnosis not present

## 2022-01-15 MED ORDER — OXYMETAZOLINE HCL 0.05 % NA SOLN
1.0000 | Freq: Once | NASAL | Status: AC
  Administered 2022-01-15: 1 via NASAL
  Filled 2022-01-15: qty 30

## 2022-01-15 NOTE — ED Notes (Signed)
Nose is bleeding since 5 am Denies any pain. Denies any shortness of breath. Blood pressure is elevated. States that she has not taken her blood pressure medication this morning. Denies any chest . Denies any s/s of hypertension.

## 2022-01-15 NOTE — ED Triage Notes (Signed)
Nose bleed started at 5 am this morning. Also bleed on Thursday. Has appointment with ENT on Monday.Currently on blood thinners

## 2022-01-17 NOTE — ED Provider Notes (Signed)
[facility] EMERGENCY DEPARTMENT Provider Note   CSN: [id number] Arrival date & time: 01/15/22  0731     History  Chief Complaint  Patient presents with   Epistaxis    Stephanie Rodgers is a 81 y.o. female.  HPI     81 year old female with a history of paroxysmal atrial fibrillation on Eliquis, hypertension who presents with concern for right-sided epistaxis.  Reports that she woke up early this morning around 5 AM with a right-sided nosebleed.  Reports it has been bleeding now for 3 hours and she is on unable to get it stopped at home.  She reports using her Afrin, and placing a nose clip on and checking it after 10 minutes.  She does report she believes she was placing the nose clip onto the bony part of her nose.  Denies shortness of breath, nasal trauma, fevers or other concerns.  She has not taken her blood pressure medications yet today.  Past Medical History:  Diagnosis Date   Atrial fibrillation ([facility])    Depression    Hyperlipidemia    Hypertension    RLS (restless legs syndrome)    Stroke ([facility])      Home Medications Prior to Admission medications   Medication Sig Start Date End Date Taking? Authorizing Provider  acetaminophen (TYLENOL) 500 MG tablet Take 1,000 mg by mouth every 6 (six) hours as needed for mild pain, fever or headache.    [provider]  apixaban (ELIQUIS) 5 MG TABS tablet Take 1 tablet (5 mg total) by mouth 2 (two) times daily. 12/11/19   Layne Benton, NP  carvedilol (COREG) 6.25 MG tablet Take 6.25 mg by mouth 2 (two) times daily as needed (Bp @ 140+).    [provider]  cephALEXin (KEFLEX) 500 MG capsule Take 1 capsule (500 mg total) by mouth 4 (four) times daily. Patient not taking: Reported on 01/25/2021 09/28/20   Terrilee Files, MD  Cholecalciferol (VITAMIN D3 PO) Take 1 tablet by mouth daily.    [provider]  clonazePAM (KLONOPIN) 0.5 MG tablet Take 0.5 mg by mouth at bedtime. 02/20/19   [provider]  flecainide (TAMBOCOR) 50 MG tablet Take 50 mg by mouth 2 (two) times daily.    [provider]  irbesartan (AVAPRO) 300 MG tablet Take 300 mg by mouth daily. 03/25/20   [provider]  loratadine-pseudoephedrine (CLARITIN-D 24-HOUR) 10-240 MG 24 hr tablet Take 1 tablet by mouth daily as needed for allergies.    [provider]  metoprolol tartrate (LOPRESSOR) 50 MG tablet Take 1 tablet (50 mg total) by mouth 2 (two) times daily. Patient not taking: Reported on 01/25/2021 12/11/19   Layne Benton, NP  phenazopyridine (PYRIDIUM) 200 MG tablet Take 1 tablet (200 mg total) by mouth 3 (three) times daily. Patient not taking: Reported on 01/25/2021 09/28/20   Terrilee Files, MD  rosuvastatin (CRESTOR) 40 MG tablet Take 40 mg by mouth daily. 12/24/20   [provider]  sodium chloride (MURO 128) 2 % ophthalmic solution Place 1 drop into both eyes every 3 (three) hours as needed for eye irritation.    [provider]      Allergies    Alendronate, Ciprofloxacin, Hydrochlorothiazide, Penicillins, and Colesevelam    Review of Systems   Review of Systems  Physical Exam Updated Vital Signs BP (!) 156/90   Pulse 76   Temp 97.7 F (36.5 C) (Axillary)   Resp 18  SpO2 94%  Physical Exam Vitals and nursing note reviewed.  Constitutional:      General: She is not in acute distress.    Appearance: Normal appearance. She is not ill-appearing, toxic-appearing or diaphoretic.  HENT:     Head: Normocephalic.     Nose:     Comments: Bleeding from nose more posterior than kiesselbach but present from only right side  Eyes:     Conjunctiva/sclera: Conjunctivae normal.  Cardiovascular:     Rate and Rhythm: Normal rate and regular rhythm.     Pulses: Normal pulses.  Pulmonary:     Effort: Pulmonary effort is normal. No respiratory distress.  Musculoskeletal:        General: No deformity or signs of injury.     Cervical back: No rigidity.   Skin:    General: Skin is warm and dry.     Coloration: Skin is not jaundiced or pale.  Neurological:     General: No focal deficit present.     Mental Status: She is alert and oriented to person, place, and time.     ED Results / Procedures / Treatments   Labs (all labs ordered are listed, but only abnormal results are displayed) Labs Reviewed - No data to display  EKG None  Radiology No results found.  Procedures Procedures    Medications Ordered in ED Medications  oxymetazoline (AFRIN) 0.05 % nasal spray 1 spray (1 spray Each Nare Given 01/15/22 1031)    ED Course/ Medical Decision Making/ A&P                            81 year old female with a history of paroxysmal atrial fibrillation on Eliquis, hypertension who presents with concern for right-sided epistaxis.  Discussed epistaxis care and area to hold pressure in setting of a nosebleed.  Gave Afrin in the emergency department and held pressure reevaluated with improvement.  Do not see signs of continued or active bleeding.  Blood pressure elevated on arrival to the emergency department with her not taking her blood pressure medications today me under stress, and has improved on recheck.  She is not having symptoms of a blood pressure emergency.  Able to ambulate without recurrence of bleeding.  Discussed continued supportive care, Afrin, and ENT follow-up for which she is scheduled early this week.         Final Clinical Impression(s) / ED Diagnoses Final diagnoses:  Right-sided epistaxis    Rx / DC Orders ED Discharge Orders     None         Gareth Morgan, MD 01/17/22 2332

## 2022-02-02 ENCOUNTER — Emergency Department (HOSPITAL_BASED_OUTPATIENT_CLINIC_OR_DEPARTMENT_OTHER)
Admission: EM | Admit: 2022-02-02 | Discharge: 2022-02-02 | Disposition: A | Discharge status: Home / Self Care | Payer: Medicare HMO | Attending: Emergency Medicine | Admitting: Emergency Medicine

## 2022-02-02 ENCOUNTER — Encounter (HOSPITAL_BASED_OUTPATIENT_CLINIC_OR_DEPARTMENT_OTHER): Payer: Self-pay | Admitting: Urology

## 2022-02-02 DIAGNOSIS — R040 Epistaxis: Secondary | ICD-10-CM | POA: Diagnosis not present

## 2022-02-02 LAB — CBC WITH DIFFERENTIAL/PLATELET
Abs Immature Granulocytes: 0.02 10*3/uL (ref 0.00–0.07)
Basophils Absolute: 0 10*3/uL (ref 0.0–0.1)
Basophils Relative: 1 %
Eosinophils Absolute: 0.2 10*3/uL (ref 0.0–0.5)
Eosinophils Relative: 3 %
HCT: 37.4 % (ref 36.0–46.0)
Hemoglobin: 12.2 g/dL (ref 12.0–15.0)
Immature Granulocytes: 0 %
Lymphocytes Relative: 27 %
Lymphs Abs: 1.8 10*3/uL (ref 0.7–4.0)
MCH: 30.1 pg (ref 26.0–34.0)
MCHC: 32.6 g/dL (ref 30.0–36.0)
MCV: 92.3 fL (ref 80.0–100.0)
Monocytes Absolute: 0.5 10*3/uL (ref 0.1–1.0)
Monocytes Relative: 8 %
Neutro Abs: 3.9 10*3/uL (ref 1.7–7.7)
Neutrophils Relative %: 61 %
Platelets: 249 10*3/uL (ref 150–400)
RBC: 4.05 MIL/uL (ref 3.87–5.11)
RDW: 14.6 % (ref 11.5–15.5)
WBC: 6.4 10*3/uL (ref 4.0–10.5)
nRBC: 0 % (ref 0.0–0.2)

## 2022-02-02 MED ORDER — CLONAZEPAM 1 MG PO TABS: 1.0000 mg | ORAL_TABLET | Freq: Once | ORAL | Status: DC

## 2022-02-02 MED ORDER — ONDANSETRON HCL 4 MG/2ML IJ SOLN
4.0000 mg | Freq: Once | INTRAMUSCULAR | Status: AC
  Administered 2022-02-02: 4 mg via INTRAVENOUS
  Filled 2022-02-02: qty 2

## 2022-02-02 NOTE — Discharge Instructions (Signed)
Please keep the nasal packing in place and see your ENT in the next 1-2 days. You may cough up and spit out additional clots. If heavy, bright red bleeding returns you should return to the Emergency Department for evaluation.

## 2022-02-02 NOTE — ED Provider Notes (Signed)
Emergency Department Provider Note   I have reviewed the triage vital signs and the nursing notes.   HISTORY  Chief Complaint Epistaxis   HPI Stephanie Rodgers is a 81 y.o. female with PMH of PAF on Eliquis presents to the emergency department for evaluation of epistaxis.  She has had return of heavy bleeding from the right nostril.  Notes she was seen on 10/28 with similar symptoms with pressure and Afrin.  She has since followed with ENT and reports nasal cautery with records not available for review.  She has been taking her medications as prescribed.  She had breakthrough right nostril bleeding with coughing of blood.  EMS was called and transported to the ED. Denies trauma.    Past Medical History:  Diagnosis Date   Atrial fibrillation (HCC)    Depression    Hyperlipidemia    Hypertension    RLS (restless legs syndrome)    Stroke (HCC)     Review of Systems  Constitutional: No fever/chills Eyes: No visual changes. ENT: No sore throat. Positive right nostril epistaxis.  Cardiovascular: Denies chest pain. Respiratory: Denies shortness of breath. Gastrointestinal: No abdominal pain.  No nausea, no vomiting.  No diarrhea.  No constipation. Genitourinary: Negative for dysuria. Musculoskeletal: Negative for back pain. Skin: Negative for rash. Neurological: Negative for headaches, focal weakness or numbness.   ____________________________________________   PHYSICAL EXAM:  VITAL SIGNS: ED Triage Vitals  Enc Vitals Group     BP 02/02/22 1924 (!) 169/90     Pulse Rate 02/02/22 1924 85     Resp 02/02/22 1924 18     Temp 02/02/22 1924 97.7 F (36.5 C)     Temp Source 02/02/22 1924 Tympanic     SpO2 02/02/22 1924 99 %     Weight 02/02/22 1922 180 lb 1.9 oz (81.7 kg)     Height 02/02/22 1922 5\' 4"  (1.626 m)   Constitutional: Alert and oriented. Patient appears anxious with bleeding from the right nostril.  Eyes: Conjunctivae are normal. Head: Atraumatic. Nose:  Brisk, BRB from the right nostril. Large clot removed from the nostril manually.  Mouth/Throat: Mucous membranes are moist.  Oropharynx non-erythematous. Neck: No stridor.   Cardiovascular: Normal rate, regular rhythm. Good peripheral circulation. Grossly normal heart sounds.   Respiratory: Normal respiratory effort.   Gastrointestinal: No distention.  Musculoskeletal: No gross deformities of extremities. Neurologic:  Normal speech and language.  Skin:  Skin is warm, dry and intact. No rash noted.   ____________________________________________   LABS (all labs ordered are listed, but only abnormal results are displayed)  Labs Reviewed  CBC WITH DIFFERENTIAL/PLATELET   ____________________________________________   PROCEDURES  Procedure(s) performed:   .Epistaxis Management  Date/Time: 02/04/2022 2:57 AM  Performed by: 02/06/2022, MD Authorized by: Maia Plan, MD   Consent:    Consent obtained:  Emergent situation   Consent given by:  Patient   Risks discussed:  Bleeding, nasal injury and pain Universal protocol:    Patient identity confirmed:  Verbally with patient Anesthesia:    Anesthesia method:  None Procedure details:    Treatment site:  R anterior   Treatment method:  Anterior pack and nasal balloon   Treatment complexity:  Limited   Treatment episode: recurring   Post-procedure details:    Assessment:  Bleeding stopped   Procedure completion:  Tolerated well, no immediate complications    ____________________________________________   INITIAL IMPRESSION / ASSESSMENT AND PLAN / ED COURSE  Pertinent labs &  imaging results that were available during my care of the patient were reviewed by me and considered in my medical decision making (see chart for details).   This patient is Presenting for Evaluation of epistaxis, which does require a range of treatment options, and is a complaint that involves a high risk of morbidity and mortality.  The  Differential Diagnoses include anterior bleeding, posterior bleed, traumatic bleeding, anticoagulation, etc.  Critical Interventions-    Medications  ondansetron (ZOFRAN) injection 4 mg (4 mg Intravenous Given 02/02/22 2035)    Reassessment after intervention: Symptoms improved. Bleeding stopped.   I did obtain Additional Historical Information from EMS and son at bedside.  I decided to review pertinent External Data, and in summary last ED visit on 10/28.   Clinical Laboratory Tests Ordered, included no anemia on CBC.   Medical Decision Making: Summary:  Patient arrives with brisk epistaxis from the right nostril.  She is anticoagulated.  On my initial assessment, patient had a large clot in her right nostril which was removed.  She had some coughing and vomiting as clotted blood from the oropharynx.  Rhino Rocket was placed anteriorly and bleeding stopped.  No continued oozing blood visualized in the posterior pharynx or appreciated by the patient.  He continues to feel some clot in the posterior nose and throat but no airway compromise.  Reevaluation with update and discussion with patient.  She is feeling improved.  Bleeding has completely stopped.  She was observed in the ED with no breakthrough bleeding.  She plans to reach out to the ENT who saw her recently for reevaluation in the office and removal of the Rhino Rocket in the next 2 to 3 days. Discussed ED return precautions.   Considered admission but no severe anemia. Epistaxis stopped in the ED. Patient stable for d/c and ENT follow up.   Disposition: discharge  ____________________________________________  FINAL CLINICAL IMPRESSION(S) / ED DIAGNOSES  Final diagnoses:  Epistaxis    Note:  This document was prepared using Dragon voice recognition software and may include unintentional dictation errors.  Alona Bene, MD, [facility]    Shannelle Alguire, Arlyss Repress, MD 02/04/22 [phone]

## 2022-02-02 NOTE — ED Notes (Addendum)
ED Provider at bedside, [facility] placed by provider into right nare, Large amount of blood clots coming from right nare and throat with suction.   Bleeding controlled from right nare with Rhino Rocket at this time, blood coming from right tear duct as well

## 2022-02-02 NOTE — ED Triage Notes (Signed)
Per EMS pt started having severe nose bleed 1 hr PTA  Pt on eliquis Has had 3 nose bleeds like this in the past  Had right nare cauterized 3 weeks ago  Bleeding continued and uncontrolled at this time Afrin prior to EMS arrival   A&O x 4 at this time

## 2022-02-02 NOTE — ED Notes (Addendum)
Pt continues to gag and cough trying to clear a clot she can feel in the posterior throat.  No airway compromise, Pt very anxious about returning home with nose bleed continuing.  Packing remains in place, but some breakthrough bleeding continues.  Son waiting in lobby, provided update per pt request.

## 2022-02-03 ENCOUNTER — Other Ambulatory Visit: Payer: Self-pay

## 2023-02-14 ENCOUNTER — Emergency Department (HOSPITAL_BASED_OUTPATIENT_CLINIC_OR_DEPARTMENT_OTHER): Payer: Medicare HMO

## 2023-02-14 ENCOUNTER — Emergency Department (HOSPITAL_BASED_OUTPATIENT_CLINIC_OR_DEPARTMENT_OTHER)
Admission: EM | Admit: 2023-02-14 | Discharge: 2023-02-14 | Disposition: A | Discharge status: Home / Self Care | Payer: Medicare HMO | Attending: Emergency Medicine | Admitting: Emergency Medicine

## 2023-02-14 ENCOUNTER — Encounter (HOSPITAL_BASED_OUTPATIENT_CLINIC_OR_DEPARTMENT_OTHER): Payer: Self-pay | Admitting: Radiology

## 2023-02-14 DIAGNOSIS — Z79899 Other long term (current) drug therapy: Secondary | ICD-10-CM | POA: Insufficient documentation

## 2023-02-14 DIAGNOSIS — I4891 Unspecified atrial fibrillation: Secondary | ICD-10-CM | POA: Insufficient documentation

## 2023-02-14 DIAGNOSIS — Z7901 Long term (current) use of anticoagulants: Secondary | ICD-10-CM | POA: Insufficient documentation

## 2023-02-14 DIAGNOSIS — H53122 Transient visual loss, left eye: Secondary | ICD-10-CM | POA: Diagnosis not present

## 2023-02-14 DIAGNOSIS — I10 Essential (primary) hypertension: Secondary | ICD-10-CM | POA: Diagnosis not present

## 2023-02-14 DIAGNOSIS — R519 Headache, unspecified: Secondary | ICD-10-CM

## 2023-02-14 DIAGNOSIS — H539 Unspecified visual disturbance: Secondary | ICD-10-CM

## 2023-02-14 LAB — BASIC METABOLIC PANEL
Anion gap: 7 (ref 5–15)
BUN: 15 mg/dL (ref 8–23)
CO2: 29 mmol/L (ref 22–32)
Calcium: 9.5 mg/dL (ref 8.9–10.3)
Chloride: 105 mmol/L (ref 98–111)
Creatinine, Ser: 0.72 mg/dL (ref 0.44–1.00)
GFR, Estimated: >60 mL/min (ref >60)
Glucose, Bld: 111 mg/dL — ABNORMAL HIGH (ref 70–99)
Potassium: 4.4 mmol/L (ref 3.5–5.1)
Sodium: 141 mmol/L (ref 135–145)

## 2023-02-14 LAB — CBC
HCT: 36.3 % (ref 36.0–46.0)
Hemoglobin: 11.8 g/dL — ABNORMAL LOW (ref 12.0–15.0)
MCH: 30.6 pg (ref 26.0–34.0)
MCHC: 32.5 g/dL (ref 30.0–36.0)
MCV: 94.3 fL (ref 80.0–100.0)
Platelets: 214 10*3/uL (ref 150–400)
RBC: 3.85 MIL/uL — ABNORMAL LOW (ref 3.87–5.11)
RDW: 14.4 % (ref 11.5–15.5)
WBC: 5.1 10*3/uL (ref 4.0–10.5)
nRBC: 0 % (ref 0.0–0.2)

## 2023-02-14 LAB — SEDIMENTATION RATE: Sed Rate: 33 mm/h — ABNORMAL HIGH (ref 0–22)

## 2023-02-14 LAB — C-REACTIVE PROTEIN: CRP: 0.5 mg/dL (ref <1.0)

## 2023-02-14 MED ORDER — LORAZEPAM 1 MG PO TABS: 0.5000 mg | ORAL_TABLET | ORAL | Status: DC | PRN

## 2023-02-14 MED ORDER — IOHEXOL 350 MG/ML SOLN
100.0000 mL | Freq: Once | INTRAVENOUS | Status: AC | PRN
  Administered 2023-02-14: 75 mL via INTRAVENOUS

## 2023-02-14 MED ORDER — METOCLOPRAMIDE HCL 5 MG/ML IJ SOLN
10.0000 mg | Freq: Once | INTRAMUSCULAR | Status: AC
  Administered 2023-02-14: 10 mg via INTRAVENOUS
  Filled 2023-02-14: qty 2

## 2023-02-14 MED ORDER — DIPHENHYDRAMINE HCL 50 MG/ML IJ SOLN
12.5000 mg | Freq: Once | INTRAMUSCULAR | Status: AC
  Administered 2023-02-14: 12.5 mg via INTRAVENOUS
  Filled 2023-02-14: qty 1

## 2023-02-14 NOTE — ED Provider Notes (Signed)
[place] [facility] Provider Note   CSN: [id number] Arrival date & time: 02/14/23  1124     History  Chief Complaint  Patient presents with   Headache    Stephanie Rodgers is a 82 y.o. female.   Headache    82 year old female with medical history significant for hypertension, hyperlipidemia, atrial fibrillation, prior CVA on Eliquis who presents to the emergency department with headache.  The patient states that she woke up this morning around 7 and felt somewhat normal.  She developed a sudden onset headache at 730 the reach maximal intensity.  She at that time she also developed a visual field cut in the left lateral aspect of her vision.  Her visual deficit lasted for around 10 minutes and has since resolved.  She denies double vision.  She denies any facial droop, she denies any focal numbness or weakness.  She has no tenderness over her temple.  She has a mild headache that improved somewhat with Tylenol.  She denies any fevers or neck stiffness.  Home Medications Prior to Admission medications   Medication Sig Start Date End Date Taking? Authorizing Provider  Cholecalciferol (VITAMIN D3 PO) Take 1 tablet by mouth daily.   Yes [provider]  clonazePAM (KLONOPIN) 0.5 MG tablet Take 0.5 mg by mouth at bedtime. 02/20/19  Yes [provider]  flecainide (TAMBOCOR) 50 MG tablet Take 50 mg by mouth 2 (two) times daily.   Yes [provider]  loratadine-pseudoephedrine (CLARITIN-D 24-HOUR) 10-240 MG 24 hr tablet Take 1 tablet by mouth daily as needed for allergies.   Yes [provider]  metoprolol tartrate (LOPRESSOR) 50 MG tablet Take 1 tablet (50 mg total) by mouth 2 (two) times daily. 12/11/19  Yes Layne Benton, NP  rosuvastatin (CRESTOR) 40 MG tablet Take 40 mg by mouth daily. 12/24/20  Yes [provider]  acetaminophen (TYLENOL) 500 MG tablet Take 1,000 mg by mouth every 6 (six) hours as needed for  mild pain, fever or headache.    [provider]  apixaban (ELIQUIS) 5 MG TABS tablet Take 1 tablet (5 mg total) by mouth 2 (two) times daily. 12/11/19   Layne Benton, NP  carvedilol (COREG) 6.25 MG tablet Take 6.25 mg by mouth 2 (two) times daily as needed (Bp @ 140+).    [provider]  cephALEXin (KEFLEX) 500 MG capsule Take 1 capsule (500 mg total) by mouth 4 (four) times daily. Patient not taking: Reported on 01/25/2021 09/28/20   Terrilee Files, MD  irbesartan (AVAPRO) 300 MG tablet Take 300 mg by mouth daily. 03/25/20   [provider]  phenazopyridine (PYRIDIUM) 200 MG tablet Take 1 tablet (200 mg total) by mouth 3 (three) times daily. Patient not taking: Reported on 01/25/2021 09/28/20   Terrilee Files, MD  sodium chloride (MURO 128) 2 % ophthalmic solution Place 1 drop into both eyes every 3 (three) hours as needed for eye irritation.    [provider]      Allergies    Alendronate, Ciprofloxacin, Hydrochlorothiazide, Penicillins, and Colesevelam    Review of Systems   Review of Systems  Eyes:  Positive for visual disturbance.  Neurological:  Positive for headaches.  All other systems reviewed and are negative.   Physical Exam Updated Vital Signs BP (!) 157/71   Pulse (!) 54   Temp (!) 97.4 F (36.3 C)   Resp 13   Ht 5\' 4"  (1.626 m)  Wt 91.6 kg   SpO2 100%   BMI 34.67 kg/m  Physical Exam Vitals and nursing note reviewed.  Constitutional:      General: She is not in acute distress.    Appearance: She is well-developed.  HENT:     Head: Normocephalic and atraumatic.     Comments: No temporal arterial tenderness, no meningismus Eyes:     Conjunctiva/sclera: Conjunctivae normal.  Cardiovascular:     Rate and Rhythm: Normal rate and regular rhythm.  Pulmonary:     Effort: Pulmonary effort is normal. No respiratory distress.     Breath sounds: Normal breath sounds.  Abdominal:     Palpations: Abdomen is soft.      Tenderness: There is no abdominal tenderness.  Musculoskeletal:        General: No swelling.     Cervical back: Neck supple.  Skin:    General: Skin is warm and dry.     Capillary Refill: Capillary refill takes less than 2 seconds.  Neurological:     Comments: MENTAL STATUS EXAM:    Orientation: Alert and oriented to person, place and time.  Memory: Cooperative, follows commands well.  Language: Speech is clear and language is normal.   CRANIAL NERVES:    CN 2 (Optic): Visual fields intact to confrontation.  CN 3,4,6 (EOM): Pupils equal and reactive to light. Full extraocular eye movement without nystagmus.  CN 5 (Trigeminal): Facial sensation is normal, no weakness of masticatory muscles.  CN 7 (Facial): No facial weakness or asymmetry.  CN 8 (Auditory): Auditory acuity grossly normal.  CN 9,10 (Glossophar): The uvula is midline, the palate elevates symmetrically.  CN 11 (spinal access): Normal sternocleidomastoid and trapezius strength.  CN 12 (Hypoglossal): The tongue is midline. No atrophy or fasciculations.Marland Kitchen   MOTOR:  Muscle Strength: 5/5RUE, 5/5LUE, 5/5RLE, 5/5LLE.   COORDINATION:   Intact finger-to-nose, no tremor.   SENSATION:   Intact to light touch all four extremities.  GAIT: Gait normal without ataxia   Psychiatric:        Mood and Affect: Mood normal.        Speech: Speech normal.        Behavior: Behavior normal.     ED Results / Procedures / Treatments   Labs (all labs ordered are listed, but only abnormal results are displayed) Labs Reviewed  BASIC METABOLIC PANEL - Abnormal; Notable for the following components:      Result Value   Glucose, Bld 111 (*)    All other components within normal limits  CBC - Abnormal; Notable for the following components:   RBC 3.85 (*)    Hemoglobin 11.8 (*)    All other components within normal limits  SEDIMENTATION RATE  C-REACTIVE PROTEIN    EKG EKG Interpretation Date/Time:  Tuesday February 14 2023 11:32:44  EST Ventricular Rate:  53 PR Interval:  225 QRS Duration:  115 QT Interval:  455 QTC Calculation: 428 R Axis:   78  Text Interpretation: Sinus bradycardia Prolonged PR interval Nonspecific intraventricular conduction delay Confirmed by Ernie Avena ([id number]) on 02/14/2023 12:42:31 PM  Radiology CT ANGIO HEAD NECK W WO CM  Result Date: 02/14/2023 CLINICAL DATA:  Vision loss, monocular EXAM: CT ANGIOGRAPHY HEAD AND NECK WITH AND WITHOUT CONTRAST TECHNIQUE: Multidetector CT imaging of the head and neck was performed using the standard protocol during bolus administration of intravenous contrast. Multiplanar CT image reconstructions and MIPs were obtained to evaluate the vascular anatomy. Carotid stenosis measurements (when applicable) are obtained  utilizing NASCET criteria, using the distal internal carotid diameter as the denominator. RADIATION DOSE REDUCTION: This exam was performed according to the departmental dose-optimization program which includes automated exposure control, adjustment of the mA and/or kV according to patient size and/or use of iterative reconstruction technique. CONTRAST:  75mL OMNIPAQUE IOHEXOL 350 MG/ML SOLN COMPARISON:  MRAHead/neck 12/08/19 FINDINGS: CT HEAD FINDINGS Brain: No hemorrhage. No hydrocephalus. No extra-axial fluid collection. No mass effect. No mass lesion. No CT evidence of an acute cortical infarct. There is a background of moderate chronic microvascular ischemic change. Mineralization of the basal ganglia bilaterally. Vascular: No hyperdense vessel or unexpected calcification. Skull: Normal. Negative for fracture or focal lesion. Sinuses/Orbits: Middle ear or mastoid effusion. Complete opacification of the left maxillary sinus. Bilateral lens replacement. Orbits are otherwise unremarkable. Other: None. Review of the MIP images confirms the above findings CTA NECK FINDINGS Aortic arch: Standard branching. Imaged portion shows no evidence of aneurysm or dissection.  No significant stenosis of the major arch vessel origins. Right carotid system: No evidence of dissection, stenosis (50% or greater), or occlusion. Slightly beaded appearance of the cervical ICA, which could be seen in the setting of FMD. Left carotid system: No evidence of dissection, stenosis (50% or greater), or occlusion.Slightly beaded appearance of the cervical ICA, which could be seen in the setting of FMD. Vertebral arteries: Codominant. No evidence of dissection, stenosis (50% or greater), or occlusion. Skeleton: Negative. Other neck: There is a 1.0 cm contrast-enhancing lesion in the left parotid gland (series 604, image 210). This is likely present on prior MRA neck dated 12/08/2019. Upper chest: Negative. Review of the MIP images confirms the above findings CTA HEAD FINDINGS Anterior circulation: No significant stenosis, proximal occlusion, aneurysm, or vascular malformation. Posterior circulation: No significant stenosis, proximal occlusion, aneurysm, or vascular malformation. Venous sinuses: As permitted by contrast timing, patent. Anatomic variants: No Review of the MIP images confirms the above findings IMPRESSION: 1. No acute intracranial abnormality. 2. No intracranial large vessel occlusion or significant stenosis. 3. No hemodynamically significant stenosis in the neck. 4. Slightly beaded appearance of the cervical ICAs bilaterally, which could be seen in the setting of FMD. Electronically Signed   By: Lorenza Cambridge M.D.   On: 02/14/2023 13:34    Procedures Procedures    Medications Ordered in ED Medications  metoCLOPramide (REGLAN) injection 10 mg (10 mg Intravenous Given 02/14/23 1204)  diphenhydrAMINE (BENADRYL) injection 12.5 mg (12.5 mg Intravenous Given 02/14/23 1203)  iohexol (OMNIPAQUE) 350 MG/ML injection 100 mL (75 mLs Intravenous Contrast Given 02/14/23 1300)    ED Course/ Medical Decision Making/ A&P                                 Medical Decision Making Amount  and/or Complexity of Data Reviewed Labs: ordered. Radiology: ordered.  Risk Prescription drug management.     82 year old female with medical history significant for hypertension, hyperlipidemia, atrial fibrillation, prior CVA on Eliquis who presents to the emergency department with headache.  The patient states that she woke up this morning around 7 and felt somewhat normal.  She developed a sudden onset headache at 730 the reach maximal intensity.  She at that time she also developed a visual field cut in the left lateral aspect of her vision.  Her visual deficit lasted for around 10 minutes and has since resolved.  She denies double vision.  She denies any facial droop, she denies any  focal numbness or weakness.  She has no tenderness over her temple.  She has a mild headache that improved somewhat with Tylenol.  She denies any fevers or neck stiffness.  Stephanie Rodgers is a 82 y.o. female who presents with headache and transient visual field deficit as per above. I have reviewed the nursing documentation for past medical history, family history, and social history. I have reviewed the EMR and have learned that the patient has a hx of CVA and has been compliant with her Eliquis.  On arrival, she was vitally stable. Currently, she is awake, alert, GCS 15, HDS, and afebrile. Her exam is most notable for fully intact extraocular motions with bilaterally reactive pupils, no focal neurologic deficits, no meningismus, and no temporal tenderness. There is no rash.   I am most concerned for migraine headache vs ICH.  To further evaluate and risk stratify her, labs and imaging were obtained, which were significant for:  Labs: CBC without a leukocytosis, mild anemia to 11.8, BMP unremarkable  Imaging:  IMPRESSION:  1. No acute intracranial abnormality.  2. No intracranial large vessel occlusion or significant stenosis.  3. No hemodynamically significant stenosis in the neck.  4. Slightly beaded  appearance of the cervical ICAs bilaterally,  which could be seen in the setting of FMD.   On repeat assessment, the patient's symptoms are completely resolved.  She has no visual field deficit.  She has no temporal arterial tenderness.  She has no meningismus and is afebrile and overall well-appearing.  She has been compliant with her Eliquis and has not missed any doses.  I do not think the patient has an aneurysm, intracranial bleed, mass lesion, meningitis, temporal arteritis, stroke, cluster headache, idiopathic intracranial hypertension, cavernous sinus thrombosis, carbon monoxide toxicity, herpes zoster, carotid or vertebral artery dissection, or acute angle close glaucoma.  On reassessment, the patient remained well appearing and was again able to ambulate without difficulty and did not have any focal neurologic deficits.  I believe the patient is stable for discharge with outpatient neurology follow-up.  We participated in shared decision making regarding continued observation in the ED versus discharge home for continued recovery after receiving the below medications. She preferred to recover at home. I believe that this is safe and reasonable.  We have discussed the diagnosis and risks, and we agree with discharging home to follow-up with their primary doctor. We also discussed returning to the Emergency Department immediately if new or worsening symptoms occur. We have discussed the symptoms which are most concerning (e.g., changing or worsening pain, weakness, vomiting, fever, or abnormal sensation) that necessitate immediate return. I provided ED return precautions. The patient felt safe with this plan.  ED Medication Summary: Medications  metoCLOPramide (REGLAN) injection 10 mg (10 mg Intravenous Given 02/14/23 1204)  diphenhydrAMINE (BENADRYL) injection 12.5 mg (12.5 mg Intravenous Given 02/14/23 1203)  iohexol (OMNIPAQUE) 350 MG/ML injection 100 mL (75 mLs Intravenous Contrast Given  02/14/23 1300)     Final Clinical Impression(s) / ED Diagnoses Final diagnoses:  Acute nonintractable headache, unspecified headache type  Transient vision disturbance of left eye    Rx / DC Orders ED Discharge Orders     None         Ernie Avena, MD 02/14/23 1558

## 2023-02-14 NOTE — ED Triage Notes (Signed)
Pt reports to ED via [facility]. Pt describes waking up this morning @ 0700 and felt clouded in her head and had a headache States that she she cares for her husband and feels like she is stressed due that anxiety. Hx of stroke no deficits noted.

## 2023-02-14 NOTE — Discharge Instructions (Addendum)
Please follow-up with your neurologist and ophthalmologist outpatient. Your CT angiogram revealed no evidence of mass or bleeding in your brain. Continue to take your Eliquis as prescribed.

## 2023-02-14 NOTE — ED Notes (Signed)
Fall risk armband Fall risk sign on door Fall risk socks

## 2023-10-28 ENCOUNTER — Encounter (HOSPITAL_BASED_OUTPATIENT_CLINIC_OR_DEPARTMENT_OTHER): Payer: Self-pay

## 2023-10-28 ENCOUNTER — Observation Stay (HOSPITAL_BASED_OUTPATIENT_CLINIC_OR_DEPARTMENT_OTHER)
Admission: EM | Admit: 2023-10-28 | Discharge: 2023-10-30 | Disposition: A | Discharge status: Home / Self Care | Attending: Internal Medicine | Admitting: Internal Medicine

## 2023-10-28 ENCOUNTER — Other Ambulatory Visit: Payer: Self-pay

## 2023-10-28 DIAGNOSIS — I4819 Other persistent atrial fibrillation: Secondary | ICD-10-CM

## 2023-10-28 DIAGNOSIS — R42 Dizziness and giddiness: Principal | ICD-10-CM

## 2023-10-28 DIAGNOSIS — I4891 Unspecified atrial fibrillation: Secondary | ICD-10-CM | POA: Diagnosis not present

## 2023-10-28 DIAGNOSIS — E785 Hyperlipidemia, unspecified: Secondary | ICD-10-CM | POA: Diagnosis not present

## 2023-10-28 DIAGNOSIS — R770 Abnormality of albumin: Secondary | ICD-10-CM | POA: Insufficient documentation

## 2023-10-28 DIAGNOSIS — Z6834 Body mass index (BMI) 34.0-34.9, adult: Secondary | ICD-10-CM | POA: Diagnosis not present

## 2023-10-28 DIAGNOSIS — R55 Syncope and collapse: Secondary | ICD-10-CM | POA: Diagnosis not present

## 2023-10-28 DIAGNOSIS — I10 Essential (primary) hypertension: Secondary | ICD-10-CM | POA: Insufficient documentation

## 2023-10-28 DIAGNOSIS — E66811 Obesity, class 1: Secondary | ICD-10-CM | POA: Insufficient documentation

## 2023-10-28 LAB — CBC WITH DIFFERENTIAL/PLATELET
Abs Immature Granulocytes: 0.01 K/uL (ref 0.00–0.07)
Basophils Absolute: 0.1 K/uL (ref 0.0–0.1)
Basophils Relative: 1 %
Eosinophils Absolute: 0.1 K/uL (ref 0.0–0.5)
Eosinophils Relative: 2 %
HCT: 42.1 % (ref 36.0–46.0)
Hemoglobin: 14 g/dL (ref 12.0–15.0)
Immature Granulocytes: 0 %
Lymphocytes Relative: 32 %
Lymphs Abs: 2.4 K/uL (ref 0.7–4.0)
MCH: 30.4 pg (ref 26.0–34.0)
MCHC: 33.3 g/dL (ref 30.0–36.0)
MCV: 91.5 fL (ref 80.0–100.0)
Monocytes Absolute: 0.6 K/uL (ref 0.1–1.0)
Monocytes Relative: 9 %
Neutro Abs: 4.1 K/uL (ref 1.7–7.7)
Neutrophils Relative %: 56 %
Platelets: 230 K/uL (ref 150–400)
RBC: 4.6 MIL/uL (ref 3.87–5.11)
RDW: 15 % (ref 11.5–15.5)
WBC: 7.3 K/uL (ref 4.0–10.5)
nRBC: 0 % (ref 0.0–0.2)

## 2023-10-28 LAB — COMPREHENSIVE METABOLIC PANEL WITH GFR
ALT: 18 U/L (ref 0–44)
AST: 35 U/L (ref 15–41)
Albumin: 4.2 g/dL (ref 3.5–5.0)
Alkaline Phosphatase: 100 U/L (ref 38–126)
Anion gap: 14 (ref 5–15)
BUN: 22 mg/dL (ref 8–23)
CO2: 20 mmol/L — ABNORMAL LOW (ref 22–32)
Calcium: 9.5 mg/dL (ref 8.9–10.3)
Chloride: 105 mmol/L (ref 98–111)
Creatinine, Ser: 0.87 mg/dL (ref 0.44–1.00)
GFR, Estimated: >60 mL/min (ref >60)
Glucose, Bld: 176 mg/dL — ABNORMAL HIGH (ref 70–99)
Potassium: 4.4 mmol/L (ref 3.5–5.1)
Sodium: 139 mmol/L (ref 135–145)
Total Bilirubin: 0.4 mg/dL (ref 0.0–1.2)
Total Protein: 7.3 g/dL (ref 6.5–8.1)

## 2023-10-28 NOTE — ED Triage Notes (Signed)
 Pt states she has been dizzy today that has gotten worse throughout the day. Pt describes it as she is spinning. Pt tearful & anxious at time of triage.  Denies chest pain. Endorses SOB.

## 2023-10-28 NOTE — Plan of Care (Signed)
 Plan of Care Note for accepted transfer   Patient: Stephanie Rodgers MRN: [id number]   DOA: 10/28/2023  Facility requesting transfer: [facility]  Requesting Provider: PA Dorn  Reason for transfer: dizziness in the setting of Afib cardio consulted recommends observation on tele  Facility course: 83 y.o. female who presented to the ED  with intermittent dizziness and episodes of near syncope workup unremearkable admit under obs for monitoring and cardiology evaluation.   Plan of care: The patient is accepted for admission to Telemetry unit, at [facility]..  Afib with dizziness.   Author: Albina Sor, MD 10/28/2023  Check [email or web address] for on-call coverage.  Nursing staff, Please call [facility] Admits & Consults System-Wide number on Amion as soon as patient's arrival, so appropriate admitting provider can evaluate the pt.

## 2023-10-28 NOTE — ED Provider Notes (Signed)
 [facility] Provider Note   CSN: [id number] Arrival date & time: 10/28/23  2042     Patient presents with: Dizziness   Stephanie Rodgers is a 83 y.o. female who states that she has had persistent dizziness and near syncope that has been getting worse throughout the day.  She has a history of atrial fibrillation for which she takes metoprolol  as well as diltiazem .  She has an ablation scheduled for 26 August, has had no recent changes in her cardiovascular medications, and currently is not on any ambulatory monitoring.  She denies having any chest pain, palpitations, shortness of breath, but during her last year syncopal event had a onset of acute panic attack and vocalizes persistent worry regarding her heart condition.  She had a transthoracic echo in May 2025 which demonstrated an LVEF of 55 to 60%.  She has severely dilated left atrium with moderate aortic stenosis, and mild aortic regurgitation.    Dizziness      Prior to Admission medications   Medication Sig Start Date End Date Taking? Authorizing Provider  acetaminophen  (TYLENOL ) 500 MG tablet Take 1,000 mg by mouth every 6 (six) hours as needed for mild pain, fever or headache.    [provider]  apixaban  (ELIQUIS ) 5 MG TABS tablet Take 1 tablet (5 mg total) by mouth 2 (two) times daily. 12/11/19   Noemi Reena CROME, NP  carvedilol  (COREG ) 6.25 MG tablet Take 6.25 mg by mouth 2 (two) times daily as needed (Bp @ 140+).    [provider]  cephALEXin  (KEFLEX ) 500 MG capsule Take 1 capsule (500 mg total) by mouth 4 (four) times daily. Patient not taking: Reported on 01/25/2021 09/28/20   Towana Ozell BROCKS, MD  Cholecalciferol  (VITAMIN D3 PO) Take 1 tablet by mouth daily.    [provider]  clonazePAM  (KLONOPIN ) 0.5 MG tablet Take 0.5 mg by mouth at bedtime. 02/20/19   [provider]  flecainide (TAMBOCOR) 50 MG tablet Take 50 mg by mouth 2 (two) times daily.     [provider]  irbesartan  (AVAPRO ) 300 MG tablet Take 300 mg by mouth daily. 03/25/20   [provider]  loratadine-pseudoephedrine (CLARITIN-D 24-HOUR) 10-240 MG 24 hr tablet Take 1 tablet by mouth daily as needed for allergies.    [provider]  metoprolol  tartrate (LOPRESSOR ) 50 MG tablet Take 1 tablet (50 mg total) by mouth 2 (two) times daily. 12/11/19   Noemi Reena CROME, NP  phenazopyridine  (PYRIDIUM ) 200 MG tablet Take 1 tablet (200 mg total) by mouth 3 (three) times daily. Patient not taking: Reported on 01/25/2021 09/28/20   Towana Ozell BROCKS, MD  rosuvastatin  (CRESTOR ) 40 MG tablet Take 40 mg by mouth daily. 12/24/20   [provider]  sodium chloride  (MURO 128) 2 % ophthalmic solution Place 1 drop into both eyes every 3 (three) hours as needed for eye irritation.    [provider]    Allergies: Alendronate, Ciprofloxacin, Hydrochlorothiazide, Penicillins, and Colesevelam    Review of Systems  Neurological:  Positive for dizziness.  All other systems reviewed and are negative.   Updated Vital Signs BP (!) 114/58   Pulse 79   Temp 97.8 F (36.6 C) (Oral)   Resp 14   Ht 5' 4 (1.626 m)   Wt 93 kg   SpO2 97%   BMI 35.19 kg/m   Physical Exam Vitals and nursing note reviewed.  Constitutional:      General:  She is not in acute distress.    Appearance: Normal appearance.  HENT:     Head: Normocephalic and atraumatic.     Mouth/Throat:     Mouth: Mucous membranes are moist.     Pharynx: Oropharynx is clear.  Eyes:     Extraocular Movements: Extraocular movements intact.     Conjunctiva/sclera: Conjunctivae normal.     Pupils: Pupils are equal, round, and reactive to light.  Cardiovascular:     Rate and Rhythm: Normal rate. Rhythm irregularly irregular.     Pulses: Normal pulses.          Carotid pulses are 2+ on the right side and 2+ on the left side.    Heart sounds: S1 normal and S2 normal. Murmur heard.     Systolic  murmur is present with a grade of 3/6.     No friction rub. No gallop.     Comments: Grade 3 systolic murmur noted loudest at the apex Pulmonary:     Effort: Pulmonary effort is normal.     Breath sounds: Normal breath sounds.  Abdominal:     General: Abdomen is flat. Bowel sounds are normal.     Palpations: Abdomen is soft.  Musculoskeletal:        General: Normal range of motion.     Cervical back: Normal range of motion and neck supple.     Right lower leg: No edema.     Left lower leg: No edema.  Skin:    General: Skin is warm and dry.     Capillary Refill: Capillary refill takes less than 2 seconds.  Neurological:     General: No focal deficit present.     Mental Status: She is alert. Mental status is at baseline.  Psychiatric:        Mood and Affect: Mood normal.     (all labs ordered are listed, but only abnormal results are displayed) Labs Reviewed  COMPREHENSIVE METABOLIC PANEL WITH GFR - Abnormal; Notable for the following components:      Result Value   CO2 20 (*)    Glucose, Bld 176 (*)    All other components within normal limits  CBC WITH DIFFERENTIAL/PLATELET    EKG: EKG Interpretation Date/Time:  Saturday October 28 2023 21:01:54 EDT Ventricular Rate:  84 PR Interval:    QRS Duration:  116 QT Interval:  359 QTC Calculation: 407 R Axis:   68  Text Interpretation: Atrial fibrillation Nonspecific intraventricular conduction delay Borderline repolarization abnormality Confirmed by Zackowski, Scott [id number]) on 10/28/2023 9:41:35 PM  Radiology: No results found.   Procedures   Medications Ordered in the ED - No data to display                                  Medical Decision Making Amount and/or Complexity of Data Reviewed Labs: ordered.   Medical Decision Making:   IKIA CINCOTTA is a 83 y.o. female who presented to the ED today with intermittent dizziness and episodes of near syncope detailed above.    Additional history discussed with  patient's family/caregivers.  External chart has been reviewed including previous echocardiogram records as well as previous labs and imaging. Patient's presentation is complicated by their history of atrial fibrillation.  Patient placed on continuous vitals and telemetry monitoring while in ED which was reviewed periodically.  Complete initial physical exam performed, notably the patient  was alert and  oriented no apparent distress.  Notable exam findings of irregularly irregular pulse with EKG findings of atrial fibrillation..    Reviewed and confirmed nursing documentation for past medical history, family history, social history.    Initial Assessment:   With the patient's presentation of near syncope and dizziness, most likely diagnosis is dizziness secondary to episodes of A-fib with RVR.  Further consider metabolic derangement, dehydration, orthostatic hypotension, vasovagal syncope.  Initial Plan:   Screening labs including CBC and Metabolic panel to evaluate for infectious or metabolic etiology of disease.  Consult with cardiology after initial workup for recommendations regarding management. EKG to evaluate for cardiac pathology Objective evaluation as below reviewed   Initial Study Results:   Laboratory  All laboratory results reviewed without evidence of clinically relevant pathology.   Exceptions include: None  EKG EKG was reviewed independently.  She has an irregular rate and rhythm consistent with persistent atrial fibrillation without any extrasystole noted and no aberrancy.   Consults: Case discussed with Dr. Floretta with the cardiology team.   Reassessment and Plan:   After initial workup and review of clinical presentation, consulted with Dr. Leafy cardiology team.  After review of her condition and medical records, Dr. Leafy does recommend that patient be brought in for observational management secondary to her history of A-fib and possible etiology of episodes of  RVR.  At this time plan is to admit patient for continued observation, consult with hospitalist team for same.  Cardiology team will follow her inpatient on tomorrow.       Final diagnoses:  Dizziness  Near syncope  Persistent atrial fibrillation [facility])    ED Discharge Orders     None          Myriam Dorn BROCKS, [place] 10/28/23 2300    Geraldene Hamilton, MD 10/30/23 0010

## 2023-10-29 DIAGNOSIS — E785 Hyperlipidemia, unspecified: Secondary | ICD-10-CM | POA: Diagnosis not present

## 2023-10-29 DIAGNOSIS — E66811 Obesity, class 1: Secondary | ICD-10-CM | POA: Diagnosis not present

## 2023-10-29 DIAGNOSIS — R770 Abnormality of albumin: Secondary | ICD-10-CM | POA: Diagnosis not present

## 2023-10-29 DIAGNOSIS — R42 Dizziness and giddiness: Secondary | ICD-10-CM | POA: Diagnosis present

## 2023-10-29 DIAGNOSIS — I10 Essential (primary) hypertension: Secondary | ICD-10-CM | POA: Diagnosis not present

## 2023-10-29 DIAGNOSIS — R55 Syncope and collapse: Secondary | ICD-10-CM | POA: Diagnosis not present

## 2023-10-29 DIAGNOSIS — I4891 Unspecified atrial fibrillation: Secondary | ICD-10-CM | POA: Diagnosis not present

## 2023-10-29 DIAGNOSIS — Z6834 Body mass index (BMI) 34.0-34.9, adult: Secondary | ICD-10-CM | POA: Diagnosis not present

## 2023-10-29 LAB — MRSA NEXT GEN BY PCR, NASAL: MRSA by PCR Next Gen: NOT DETECTED

## 2023-10-29 MED ORDER — SODIUM CHLORIDE 0.9 % IV SOLN: INTRAVENOUS | Status: AC

## 2023-10-29 MED ORDER — APIXABAN 5 MG PO TABS
5.0000 mg | ORAL_TABLET | Freq: Two times a day (BID) | ORAL | Status: DC
  Administered 2023-10-29 – 2023-10-30 (×3): 5 mg via ORAL
  Filled 2023-10-29 (×2): qty 1

## 2023-10-29 MED ORDER — ONDANSETRON HCL 4 MG/2ML IJ SOLN: 4.0000 mg | Freq: Four times a day (QID) | INTRAMUSCULAR | Status: DC | PRN

## 2023-10-29 MED ORDER — APIXABAN 5 MG PO TABS
5.0000 mg | ORAL_TABLET | Freq: Two times a day (BID) | ORAL | Status: DC
  Filled 2023-10-29: qty 1

## 2023-10-29 MED ORDER — LORAZEPAM 1 MG PO TABS
1.0000 mg | ORAL_TABLET | Freq: Once | ORAL | Status: AC
  Administered 2023-10-29: 1 mg via ORAL
  Filled 2023-10-29: qty 1

## 2023-10-29 MED ORDER — VITAMIN D 25 MCG (1000 UNIT) PO TABS
1000.0000 [IU] | ORAL_TABLET | Freq: Every day | ORAL | Status: DC
  Administered 2023-10-29 – 2023-10-30 (×3): 1000 [IU] via ORAL
  Filled 2023-10-29 (×2): qty 1

## 2023-10-29 MED ORDER — ACETAMINOPHEN 650 MG RE SUPP: 650.0000 mg | Freq: Four times a day (QID) | RECTAL | Status: DC | PRN

## 2023-10-29 MED ORDER — ROSUVASTATIN CALCIUM 20 MG PO TABS
40.0000 mg | ORAL_TABLET | Freq: Every day | ORAL | Status: DC
  Administered 2023-10-29 – 2023-10-30 (×3): 40 mg via ORAL
  Filled 2023-10-29 (×2): qty 2

## 2023-10-29 MED ORDER — CARVEDILOL 12.5 MG PO TABS: 12.5000 mg | ORAL_TABLET | Freq: Every evening | ORAL | Status: DC

## 2023-10-29 MED ORDER — IRBESARTAN 150 MG PO TABS: 150.0000 mg | ORAL_TABLET | Freq: Every day | ORAL | Status: DC | PRN

## 2023-10-29 MED ORDER — DILTIAZEM HCL ER COATED BEADS 120 MG PO CP24
120.0000 mg | ORAL_CAPSULE | Freq: Every day | ORAL | Status: DC
  Administered 2023-10-29 – 2023-10-30 (×3): 120 mg via ORAL
  Filled 2023-10-29 (×2): qty 1

## 2023-10-29 MED ORDER — ZOLPIDEM TARTRATE 5 MG PO TABS
10.0000 mg | ORAL_TABLET | Freq: Once | ORAL | Status: AC
  Administered 2023-10-29: 10 mg via ORAL
  Filled 2023-10-29: qty 2

## 2023-10-29 MED ORDER — CLONAZEPAM 0.5 MG PO TABS: 0.5000 mg | ORAL_TABLET | Freq: Two times a day (BID) | ORAL | Status: DC | PRN

## 2023-10-29 MED ORDER — ACETAMINOPHEN 325 MG PO TABS: 650.0000 mg | ORAL_TABLET | Freq: Four times a day (QID) | ORAL | Status: DC | PRN

## 2023-10-29 MED ORDER — ZOLPIDEM TARTRATE 5 MG PO TABS: 5.0000 mg | ORAL_TABLET | Freq: Once | ORAL | Status: DC

## 2023-10-29 MED ORDER — CARVEDILOL 25 MG PO TABS
25.0000 mg | ORAL_TABLET | Freq: Every morning | ORAL | Status: DC
  Administered 2023-10-30 (×2): 25 mg via ORAL
  Filled 2023-10-29: qty 1

## 2023-10-29 MED ORDER — CARVEDILOL 12.5 MG PO TABS: 12.5000 mg | ORAL_TABLET | ORAL | Status: DC

## 2023-10-29 NOTE — ED Notes (Signed)
 [facility] called for transport.

## 2023-10-29 NOTE — Progress Notes (Signed)
(  Carryover admission to the Day Admitter; accepted by Dr.  Von as transfer from  [facility]  to a  med-tele bed at  [facility]  for dizziness, presyncope in the setting of atrial fibrillation. Please see Dr.  Bing transfer progress note for additional details).  I have placed some additional preliminary admit orders via the adult multi-morbid admission order set. I have also ordered fall precautions, prn IV Zofran , as well as morning labs that include CMP, CBC, magnesium level.    Eva Pore, DO Hospitalist

## 2023-10-29 NOTE — ED Notes (Signed)
 Pt notes dizziness from standing to transfer to bedside commode has resolved after being back in the bed.  She was placed in a position of most comfort at this time, will continue to monitor.

## 2023-10-29 NOTE — ED Notes (Signed)
 Pt repositioned and made comfortable.  She noted an attempt to try and sleep while she awaits transport.

## 2023-10-29 NOTE — ED Notes (Signed)
Pt up to ambulate to bathroom

## 2023-10-29 NOTE — Progress Notes (Signed)
 Pt requesting to have daughter on speakerphone when doctor rounds in am.

## 2023-10-29 NOTE — ED Notes (Signed)
 Pt expresses concerns for her routine nightly at home meds.  Explained to pt that it has been documented and being monitored.

## 2023-10-29 NOTE — H&P (Addendum)
 CA History and Physical    Patient: Stephanie Rodgers FMW:[id number] DOB: 27-Aug-1940 DOA: 10/28/2023 DOS: the patient was seen and examined on 10/29/2023 PCP: Norleen Nohemi Shuck, MD  Patient coming from: Home  Chief Complaint:  Chief Complaint  Patient presents with   Dizziness   HPI: Stephanie Rodgers is a 83 y.o. female with medical history significant of CVA 2/2 new onset Afib in 2021, HTN, HLD, RLS and depression p/w dizzyness/syncope iso known Afib.  Pt in [facility] until this past Saturday when she woke up and was unable to stand up due to weakness and dizziness. She initially thought it may be related to her known Afib, as she has felt as if she were in Afib for the past 15 weeks per her report; however, she tried salt and fluids with minimal improvement in her sx prior to her son bringing her to the ED later that day at 2000. Of note, pt follows with Dr. Epifanio at [facility] and has undergone 4 cardioversions with decreased sustained SR each time (SR for only 2 weeks after last cardioversion), and they're discussing the possibility of an ablation, which pt desires.  In the [facility] ED, pt hypertensive, tachypneic on RA and intermittently tacycardic rates >110. Labs unremarkable. EKG showed rate controlled Afib. EDP consulted Cards who requested [facility] telemetry bed and medicine admission.   Review of Systems: As mentioned in the history of present illness. All other systems reviewed and are negative. Past Medical History:  Diagnosis Date   Atrial fibrillation (HCC)    Depression    Hyperlipidemia    Hypertension    RLS (restless legs syndrome)    Stroke [facility])    Past Surgical History:  Procedure Laterality Date   ABDOMINAL HYSTERECTOMY     BACK SURGERY     BREAST SURGERY     CARPAL TUNNEL WITH CUBITAL TUNNEL     CHOLECYSTECTOMY     COLON SURGERY     EYE SURGERY     JOINT REPLACEMENT Bilateral    Knees   SKIN BIOPSY     TONSILLECTOMY     Social History:  reports that she has never smoked.  She has never used smokeless tobacco. She reports that she does not currently use alcohol. She reports that she does not use drugs.  Allergies  Allergen Reactions   Alendronate Other (See Comments)    Edema, joint pain Edema, joint pain    Ciprofloxacin Other (See Comments)    Abdominal pain, fatigue Abdominal pain, fatigue    Hydrochlorothiazide Other (See Comments)    gout gout    Penicillins Other (See Comments) and Rash    Welts Welts Welts Welts    Colesevelam Other (See Comments)    unknown    History reviewed. No pertinent family history.  Prior to Admission medications   Medication Sig Start Date End Date Taking? Authorizing Provider  acetaminophen  (TYLENOL ) 500 MG tablet Take 1,000 mg by mouth every 6 (six) hours as needed for mild pain, fever or headache.    [provider]  acitretin (SORIATANE) 10 MG capsule Take 10 mg by mouth daily. 09/27/23   [provider]  amiodarone (PACERONE) 200 MG tablet Take 200 mg by mouth daily. 09/25/23   [provider]  amLODipine (NORVASC) 2.5 MG tablet Take 2.5 mg by mouth daily.    [provider]  apixaban  (ELIQUIS ) 5 MG TABS tablet Take 1 tablet (5 mg total) by mouth 2 (two) times daily. 12/11/19  Noemi Reena CROME, NP  carvedilol  (COREG ) 12.5 MG tablet Take 12.5 mg by mouth 2 (two) times daily as needed (Bp @ 140+).    [provider]  Cholecalciferol  (VITAMIN D3 PO) Take 1 tablet by mouth daily.    [provider]  clindamycin (CLEOCIN) 300 MG capsule Take 300 mg by mouth 3 (three) times daily. 10/24/23   [provider]  clonazePAM  (KLONOPIN ) 0.5 MG tablet Take 0.5 mg by mouth at bedtime. 02/20/19   [provider]  cyclobenzaprine (FLEXERIL) 10 MG tablet Take 10 mg by mouth at bedtime. 08/11/23   [provider]  diltiazem  (CARDIZEM  CD) 120 MG 24 hr capsule Take 120 mg by mouth daily. 10/02/23   [provider]  flecainide (TAMBOCOR) 50 MG tablet  Take 50 mg by mouth 2 (two) times daily.    [provider]  hydrOXYzine (ATARAX) 10 MG tablet Take 10 mg by mouth at bedtime. 09/06/23   [provider]  irbesartan  (AVAPRO ) 150 MG tablet Take 150 mg by mouth daily. 07/28/23   [provider]  irbesartan  (AVAPRO ) 300 MG tablet Take 300 mg by mouth daily. 03/25/20   [provider]  loratadine-pseudoephedrine (CLARITIN-D 24-HOUR) 10-240 MG 24 hr tablet Take 1 tablet by mouth daily as needed for allergies.    [provider]  metoprolol  tartrate (LOPRESSOR ) 50 MG tablet Take 1 tablet (50 mg total) by mouth 2 (two) times daily. 12/11/19   Noemi Reena CROME, NP  mirtazapine (REMERON) 30 MG tablet Take 30 mg by mouth at bedtime. 08/28/23   [provider]  mupirocin ointment (BACTROBAN) 2 % Apply 1 Application topically 2 (two) times daily. 10/24/23   [provider]  nitrofurantoin, macrocrystal-monohydrate, (MACROBID) 100 MG capsule Take 100 mg by mouth 2 (two) times daily. 10/05/23   [provider]  rosuvastatin  (CRESTOR ) 40 MG tablet Take 40 mg by mouth daily. 12/24/20   [provider]  sodium chloride  (MURO 128) 2 % ophthalmic solution Place 1 drop into both eyes every 3 (three) hours as needed for eye irritation.    [provider]  tiZANidine (ZANAFLEX) 2 MG tablet Take 2 mg by mouth 2 (two) times daily. 10/13/23   [provider]  zolpidem  (AMBIEN ) 10 MG tablet Take 10 mg by mouth at bedtime as needed. 10/14/23   [provider]    Physical Exam: Vitals:   10/29/23 0242 10/29/23 0500 10/29/23 0607 10/29/23 0720  BP:  (!) 159/87 (!) 158/82 (!) 141/82  Pulse: 90 60 71 90  Resp: 15 20 20 20   Temp:  98.3 F (36.8 C) 98.3 F (36.8 C) 98.1 F (36.7 C)  TempSrc:  Oral Oral Oral  SpO2: 96% 95% 97% 96%  Weight:   93.2 kg   Height:  5' 4 (1.626 m)     General: Alert, oriented x3, resting comfortably in no acute distress Respiratory: Lungs clear to  auscultation bilaterally with normal respiratory effort; no w/r/r Cardiovascular: Regular rate and rhythm w/o m/r/g   Data Reviewed:  Lab Results  Component Value Date   WBC 7.3 10/28/2023   HGB 14.0 10/28/2023   HCT 42.1 10/28/2023   MCV 91.5 10/28/2023   PLT 230 10/28/2023   Lab Results  Component Value Date   GLUCOSE 176 (H) 10/28/2023   CALCIUM  9.5 10/28/2023   NA 139 10/28/2023   K 4.4 10/28/2023   CO2 20 (L) 10/28/2023   CL 105 10/28/2023   BUN 22 10/28/2023  CREATININE 0.87 10/28/2023   Lab Results  Component Value Date   ALT 18 10/28/2023   AST 35 10/28/2023   ALKPHOS 100 10/28/2023   BILITOT 0.4 10/28/2023   Lab Results  Component Value Date   INR 1.1 12/08/2019    Radiology: No results found.  Assessment and Plan: 74F h/o CVA 2/2 new onset Afib in 2021, HTN, HLD, RLS and depression p/w dizzyness/syncope iso known Afib.  Presyncope Afib Orthostatic hypotension -PT/OT consulted; apprec eval/recs -Cards curbsided who recommended holter monitor for 14 days at discharge and OP EP eval; no indication for IP ablation -MIVF: NS at 75cc/h for now -PTA Eliquis , diltiazem  and Coreg  -F/u orthostatic vitals  HLD -PTA Crestor  40mg  daily  HTN -PTA Coreg   Advance Care Planning:   Code Status: Full Code   Consults: Cards  Family Communication: N/A  Severity of Illness: The appropriate patient status for this patient is OBSERVATION. Observation status is judged to be reasonable and necessary in order to provide the required intensity of service to ensure the patient's safety. The patient's presenting symptoms, physical exam findings, and initial radiographic and laboratory data in the context of their medical condition is felt to place them at decreased risk for further clinical deterioration. Furthermore, it is anticipated that the patient will be medically stable for discharge from the hospital within 2 midnights of admission.    ------- I spent 56  minutes reviewing previous notes, at the bedside counseling/discussing the treatment plan, and performing clinical documentation.  Author: Marsha Ada, MD 10/29/2023 7:23 AM  For on call review [email or web address].

## 2023-10-30 ENCOUNTER — Observation Stay (HOSPITAL_BASED_OUTPATIENT_CLINIC_OR_DEPARTMENT_OTHER)

## 2023-10-30 DIAGNOSIS — R42 Dizziness and giddiness: Secondary | ICD-10-CM | POA: Diagnosis not present

## 2023-10-30 DIAGNOSIS — R55 Syncope and collapse: Secondary | ICD-10-CM | POA: Diagnosis not present

## 2023-10-30 DIAGNOSIS — I480 Paroxysmal atrial fibrillation: Secondary | ICD-10-CM | POA: Diagnosis not present

## 2023-10-30 LAB — COMPREHENSIVE METABOLIC PANEL WITH GFR
ALT: 18 U/L (ref 0–44)
AST: 27 U/L (ref 15–41)
Albumin: 3.3 g/dL — ABNORMAL LOW (ref 3.5–5.0)
Alkaline Phosphatase: 78 U/L (ref 38–126)
Anion gap: 10 (ref 5–15)
BUN: 14 mg/dL (ref 8–23)
CO2: 27 mmol/L (ref 22–32)
Calcium: 9.1 mg/dL (ref 8.9–10.3)
Chloride: 105 mmol/L (ref 98–111)
Creatinine, Ser: 0.93 mg/dL (ref 0.44–1.00)
GFR, Estimated: >60 mL/min (ref >60)
Glucose, Bld: 116 mg/dL — ABNORMAL HIGH (ref 70–99)
Potassium: 4.3 mmol/L (ref 3.5–5.1)
Sodium: 142 mmol/L (ref 135–145)
Total Bilirubin: 0.8 mg/dL (ref 0.0–1.2)
Total Protein: 6.5 g/dL (ref 6.5–8.1)

## 2023-10-30 LAB — ECHOCARDIOGRAM COMPLETE
AR max vel: 1.32 cm2
AV Area VTI: 1.08 cm2
AV Area mean vel: 1.47 cm2
AV Mean grad: 14.8 mmHg
AV Peak grad: 29.1 mmHg
Ao pk vel: 2.7 m/s
Area-P 1/2: 4.46 cm2
Est EF: >75
Height: 64 in
MV VTI: 1.74 cm2
S' Lateral: 2.17 cm
Weight: 3248.7 [oz_av]

## 2023-10-30 LAB — MAGNESIUM: Magnesium: 2 mg/dL (ref 1.7–2.4)

## 2023-10-30 LAB — CBC WITH DIFFERENTIAL/PLATELET
Abs Immature Granulocytes: 0.01 K/uL (ref 0.00–0.07)
Basophils Absolute: 0.1 K/uL (ref 0.0–0.1)
Basophils Relative: 1 %
Eosinophils Absolute: 0.2 K/uL (ref 0.0–0.5)
Eosinophils Relative: 3 %
HCT: 44.2 % (ref 36.0–46.0)
Hemoglobin: 14.3 g/dL (ref 12.0–15.0)
Immature Granulocytes: 0 %
Lymphocytes Relative: 31 %
Lymphs Abs: 2.1 K/uL (ref 0.7–4.0)
MCH: 30 pg (ref 26.0–34.0)
MCHC: 32.4 g/dL (ref 30.0–36.0)
MCV: 92.7 fL (ref 80.0–100.0)
Monocytes Absolute: 0.6 K/uL (ref 0.1–1.0)
Monocytes Relative: 8 %
Neutro Abs: 3.8 K/uL (ref 1.7–7.7)
Neutrophils Relative %: 57 %
Platelets: 236 K/uL (ref 150–400)
RBC: 4.77 MIL/uL (ref 3.87–5.11)
RDW: 15.1 % (ref 11.5–15.5)
WBC: 6.7 K/uL (ref 4.0–10.5)
nRBC: 0 % (ref 0.0–0.2)

## 2023-10-30 MED ORDER — SODIUM CHLORIDE 0.9 % IV SOLN: INTRAVENOUS | Status: DC

## 2023-10-30 MED ORDER — SODIUM CHLORIDE 0.9 % IV BOLUS
500.0000 mL | Freq: Once | INTRAVENOUS | Status: AC
  Administered 2023-10-30 (×2): 500 mL via INTRAVENOUS

## 2023-10-30 NOTE — Evaluation (Signed)
 Occupational Therapy Evaluation/Discharge Patient Details Name: Stephanie Rodgers MRN: [id number] DOB: 11/13/1940 Today's Date: 10/30/2023   History of Present Illness   Pt is an 83 y/o female presenting with dizziness in setting of a fib. PMH: a fib, depression, HLD, HTN, RLS, CVA     Clinical Impressions PTA, pt lives with spouse and son, typically completely Independent in all daily tasks and assisting spouse who has a hx of CVA. Pt presents now at baseline for ADLs/mobility without LOB or safety concerns. Pt aware of task modification, safety precautions and energy conservation in setting of a fib. Pt denies any concerns managing at home; no further skilled OT services needed at this time. Recommend continued OOB activity with mobility specialists while admitted.      If plan is discharge home, recommend the following:   Other (comment) (PRN)     Functional Status Assessment   Patient has not had a recent decline in their functional status     Equipment Recommendations   None recommended by OT     Recommendations for Other Services         Precautions/Restrictions   Precautions Precautions: Other (comment) Precaution/Restrictions Comments: monitor HR Restrictions Weight Bearing Restrictions Per Provider Order: No     Mobility Bed Mobility               General bed mobility comments: in recliner on entry    Transfers Overall transfer level: Independent Equipment used: None                      Balance Overall balance assessment: No apparent balance deficits (not formally assessed)                                         ADL either performed or assessed with clinical judgement   ADL Overall ADL's : Independent                                       General ADL Comments: able to mobilize around room without AD and without issues, reports going to the sink earlier to bathe UB and brush teeth. Declines  any issues with ADL mgmt     Vision Ability to See in Adequate Light: 0 Adequate Patient Visual Report: No change from baseline Vision Assessment?: No apparent visual deficits     Perception         Praxis         Pertinent Vitals/Pain Pain Assessment Pain Assessment: No/denies pain     Extremity/Trunk Assessment Upper Extremity Assessment Upper Extremity Assessment: Overall WFL for tasks assessed;Right hand dominant   Lower Extremity Assessment Lower Extremity Assessment: Overall WFL for tasks assessed   Cervical / Trunk Assessment Cervical / Trunk Assessment: Normal   Communication Communication Communication: No apparent difficulties   Cognition Arousal: Alert Behavior During Therapy: WFL for tasks assessed/performed Cognition: No apparent impairments                               Following commands: Intact       Cueing  General Comments   Cueing Techniques: Verbal cues;Gestural cues      Exercises     Shoulder Instructions      Home  Living Family/patient expects to be discharged to:: Private residence Living Arrangements: Spouse/significant other;Children Available Help at Discharge: Family;Available PRN/intermittently Type of Home: House       Home Layout: One level     Bathroom Shower/Tub: Other (comment) (tub/shower converted to walk in with small square cut out)   Bathroom Toilet: Handicapped height Bathroom Accessibility: Yes   Home Equipment: Shower seat;Grab bars - toilet;Grab bars - tub/shower;Hand held shower head          Prior Functioning/Environment Prior Level of Function : Independent/Modified Independent;Driving             Mobility Comments: no AD ADLs Comments: Indep with ADLs, IADLs though fatigued by IADL mgmt d/t a fib. Cares for husband who has had a CVA (he is able to dress self, mobilize around the home and make a simple meal)    OT Problem List:     OT Treatment/Interventions:         OT Goals(Current goals can be found in the care plan section)   Acute Rehab OT Goals Patient Stated Goal: have the ablation OT Goal Formulation: All assessment and education complete, DC therapy   OT Frequency:       Co-evaluation              AM-PAC OT 6 Clicks Daily Activity     Outcome Measure Help from another person eating meals?: None Help from another person taking care of personal grooming?: None Help from another person toileting, which includes using toliet, bedpan, or urinal?: None Help from another person bathing (including washing, rinsing, drying)?: None Help from another person to put on and taking off regular upper body clothing?: None Help from another person to put on and taking off regular lower body clothing?: None 6 Click Score: 24   End of Session Nurse Communication: Mobility status  Activity Tolerance: Patient tolerated treatment well Patient left: in chair;with call bell/phone within reach  OT Visit Diagnosis: Other (comment) (decreased cardiopulmonary tolerance)                Time: 8941-8881 OT Time Calculation (min): 20 min Charges:  OT General Charges $OT Visit: 1 Visit OT Evaluation $OT Eval Low Complexity: 1 Low  Mliss NOVAK, OTR/L Acute Rehab Services Office: [phone]   Mliss Fish 10/30/2023, 11:53 AM

## 2023-10-30 NOTE — Hospital Course (Addendum)
 The patient is an 83 year old obese Caucasian female with past medical history significant for but not limited to history of CVA, history of atrial fibrillation diagnosed in 2021, essential hypertension, hyperlipidemia, restless leg syndrome, depression as well as other comorbidities presents with dizziness and syncope in the setting of her A-fib.  She was in her usual state of health when she woke up was unable to stand due to weakness and dizziness.  She initially thought it was related to her A-fib and she felt as if she were in A-fib for the last 15 weeks per her report.  She tried salt and fluids with minimal improvement in her symptoms so she came to the ED for evaluation.  Her cardiologist is planning on doing a A-fib ablation on 11/14/2023.  She is undergone for cardioversion but had transient return of sinus rhythms but then has gone back into A-fib.  She presented to the ED and was tachypneic on room air and intermittently had tachycardia rates greater than 110.  EKG was done and showed rate controlled A-fib.  She was transferred to [facility] telemetry for further evaluation and was noted to be orthostatic.  She was given IV fluid hydration orthostatics improved.  EP cardiology reviewed her chart and felt that they could not do her EP ablation sooner than 826 and felt that she is hemodynamic stable and they recommended her being discharged home given that she is improved and following up with her electrophysiologist  Assessment and Plan:  Presyncope and Dizziness in the setting of Afib and Orthostatic hypotension -PT/OT consulted; apprec eval/recs and recommended to follow-up. - Cardiology was consulted and recommended following up in outpatient setting.  She was given IV fluid hydration improved. -MIVF: NS at 75cc/h for now and given a bolus. -PTA Eliquis , diltiazem  and Coreg  -ECHO done prior to D/C and showed LVEF of >75% with mild LVH and undtermined LVDP. RVSF was normal and see FuLL report  below. She had new mild MS and AS is now mild to moderate -F/u orthostatic vitals and she initially was orthostatic but then this was improved after TED hose and IV fluid bolus.   HLD: Continue with Crestor  40mg  daily   Essential HTN: C/w Coreg   Hypoalbuminemia: Patient's Albumin Lvl went from 4.2 -> 3.3. CTM and Trend and repeat CMP w/in 1 week  Class I Obesity: Complicates overall prognosis and care. Estimated body mass index is 34.85 kg/m as calculated from the following:   Height as of this encounter: 5' 4 (1.626 m).   Weight as of this encounter: 92.1 kg. Weight Loss and Dietary Counseling given

## 2023-10-30 NOTE — Care Management Obs Status (Signed)
 MEDICARE OBSERVATION STATUS NOTIFICATION   Patient Details  Name: Stephanie Rodgers MRN: [id number] Date of Birth: [birth year]   Medicare Observation Status Notification Given:      Verbally reviewed observation notice with Victor Corrente telephonically at [phone]- Claretta Deed 10/30/2023, 11:20 AM

## 2023-10-30 NOTE — Discharge Summary (Signed)
 Physician Discharge Summary   Patient: Stephanie Rodgers MRN: [id number] DOB: 1940/09/04  Admit date:     10/28/2023  Discharge date: 10/30/2023  Discharge Physician: Alejandro Marker, DO   PCP: Norleen Nohemi Shuck, MD   Recommendations at discharge:   Follow-up with PCP within 1 to 2 weeks repeat CBC, CMP, mag, Phos within 1 week Follow-up with cardiology in outpatient setting and patient has atrial fibrillation scheduled for 11/14/2023  Discharge Diagnoses: Principal Problem:   Dizziness  Resolved Problems:   * No resolved hospital problems. [facility] Course: The patient is an 83 year old obese Caucasian female with past medical history significant for but not limited to history of CVA, history of atrial fibrillation diagnosed in 2021, essential hypertension, hyperlipidemia, restless leg syndrome, depression as well as other comorbidities presents with dizziness and syncope in the setting of her A-fib.  She was in her usual state of health when she woke up was unable to stand due to weakness and dizziness.  She initially thought it was related to her A-fib and she felt as if she were in A-fib for the last 15 weeks per her report.  She tried salt and fluids with minimal improvement in her symptoms so she came to the ED for evaluation.  Her cardiologist is planning on doing a A-fib ablation on 11/14/2023.  She is undergone for cardioversion but had transient return of sinus rhythms but then has gone back into A-fib.  She presented to the ED and was tachypneic on room air and intermittently had tachycardia rates greater than 110.  EKG was done and showed rate controlled A-fib.  She was transferred to [facility] telemetry for further evaluation and was noted to be orthostatic.  She was given IV fluid hydration orthostatics improved.  EP cardiology reviewed her chart and felt that they could not do her EP ablation sooner than 826 and felt that she is hemodynamic stable and they recommended her being  discharged home given that she is improved and following up with her electrophysiologist  Assessment and Plan:  Presyncope and Dizziness in the setting of Afib and Orthostatic hypotension -PT/OT consulted; apprec eval/recs and recommended to follow-up. - Cardiology was consulted and recommended following up in outpatient setting.  She was given IV fluid hydration improved. -MIVF: NS at 75cc/h for now and given a bolus. -PTA Eliquis , diltiazem  and Coreg  -F/u orthostatic vitals and she initially was orthostatic but then this was improved after TED hose and IV fluid bolus.   HLD: Continue with Crestor  40mg  daily   Essential HTN: C/w Coreg   Hypoalbuminemia: Patient's Albumin Lvl went from 4.2 -> 3.3. CTM and Trend and repeat CMP w/in 1 week  Class I Obesity: Complicates overall prognosis and care. Estimated body mass index is 34.85 kg/m as calculated from the following:   Height as of this encounter: 5' 4 (1.626 m).   Weight as of this encounter: 92.1 kg. Weight Loss and Dietary Counseling given  Consultants: D/w Cards Procedures performed: ECHO  Disposition: Home Diet recommendation:  Discharge Diet Orders (From admission, onward)     Start     Ordered   10/30/23 0000  Diet - low sodium heart healthy        10/30/23 1758           Cardiac diet DISCHARGE MEDICATION: Allergies as of 10/30/2023       Reactions   Alendronate Other (See Comments)   Edema, joint pain   Ciprofloxacin Other (See Comments)  Abdominal pain, fatigue   Hydrochlorothiazide Other (See Comments)   gout   Penicillins Rash, Other (See Comments)   Welts   Amiodarone Other (See Comments)   Dizziness, numbness, fatigue.    Colesevelam Other (See Comments)   unknown        Medication List     STOP taking these medications    acitretin 10 MG capsule Commonly known as: SORIATANE   amLODipine 2.5 MG tablet Commonly known as: NORVASC   flecainide 50 MG tablet Commonly known as: TAMBOCOR    hydrOXYzine 10 MG tablet Commonly known as: ATARAX   nitrofurantoin (macrocrystal-monohydrate) 100 MG capsule Commonly known as: MACROBID       TAKE these medications    acetaminophen  500 MG tablet Commonly known as: TYLENOL  Take 1,000 mg by mouth every 6 (six) hours as needed for mild pain, fever or headache.   apixaban  5 MG Tabs tablet Commonly known as: ELIQUIS  Take 1 tablet (5 mg total) by mouth 2 (two) times daily.   carvedilol  12.5 MG tablet Commonly known as: COREG  Take 12.5-25 mg by mouth See admin instructions. 2qam 1qpm   clindamycin 300 MG capsule Commonly known as: CLEOCIN Take 300 mg by mouth 3 (three) times daily.   diltiazem  120 MG 24 hr capsule Commonly known as: CARDIZEM  CD Take 120 mg by mouth daily.   irbesartan  150 MG tablet Commonly known as: AVAPRO  Take 150 mg by mouth daily as needed (HBP).   loratadine-pseudoephedrine 10-240 MG 24 hr tablet Commonly known as: CLARITIN-D 24-hour Take 1 tablet by mouth daily.   mirtazapine 30 MG tablet Commonly known as: REMERON Take 30 mg by mouth at bedtime.   mupirocin ointment 2 % Commonly known as: BACTROBAN Apply 1 Application topically 2 (two) times daily.   rosuvastatin  40 MG tablet Commonly known as: CRESTOR  Take 40 mg by mouth at bedtime.   sodium chloride  2 % ophthalmic solution Commonly known as: MURO 128 Place 1 drop into both eyes at bedtime.   tiZANidine 2 MG tablet Commonly known as: ZANAFLEX Take 2 mg by mouth 2 (two) times daily as needed for muscle spasms.   VITAMIN D3 PO Take 1 tablet by mouth daily.   zolpidem  10 MG tablet Commonly known as: AMBIEN  Take 10 mg by mouth at bedtime.        Discharge Exam: Filed Weights   10/28/23 2054 10/29/23 0607 10/30/23 0244  Weight: 93 kg 93.2 kg 92.1 kg   Vitals:   10/30/23 1048 10/30/23 1530  BP: 128/69 121/60  Pulse: 86 71  Resp: 18 17  Temp: 98.6 F (37 C) 98.6 F (37 C)  SpO2: 96% 95%   Examination: Physical  Exam:  Constitutional: WN/WD obese Caucasian female in no acute distress Respiratory: Diminished to auscultation bilaterally, no wheezing, rales, rhonchi or crackles. Normal respiratory effort and patient is not tachypenic. No accessory muscle use.  Unlabored breathing Cardiovascular: RRR, no murmurs / rubs / gallops. S1 and S2 auscultated.  No appreciable extremity edema Abdomen: Soft, non-tender, distended secondary to body habitus. Bowel sounds positive.  GU: Deferred. Musculoskeletal: No clubbing / cyanosis of digits/nails. No joint deformity upper and lower extremities.  Skin: No rashes, lesions, ulcers, manage skin evaluation. No induration; Warm and dry.  Neurologic: CN 2-12 grossly intact with no focal deficits. Romberg sign and cerebellar reflexes not assessed.  Psychiatric: Normal judgment and insight. Alert and oriented x 3. Normal mood and appropriate affect.    Condition at discharge: stable  The results  of significant diagnostics from this hospitalization (including imaging, microbiology, ancillary and laboratory) are listed below for reference.   Imaging Studies: ECHOCARDIOGRAM COMPLETE Result Date: 10/30/2023    ECHOCARDIOGRAM REPORT   Patient Name:   Stephanie Rodgers Date of Exam: 10/30/2023 Medical Rec #:  [id number]      Height:       64.0 in Accession #:    [id number]     Weight:       203.0 lb Date of Birth:  1940-04-29      BSA:          1.969 m Patient Age:    82 years       BP:           121/60 mmHg Patient Gender: F              HR:           79 bpm. Exam Location:  Inpatient Procedure: 2D Echo, Cardiac Doppler and Color Doppler (Both Spectral and Color            Flow Doppler were utilized during procedure). Indications:    Syncope  History:        Patient has prior history of Echocardiogram examinations, most                 recent 12/08/2019. Arrythmias:Atrial Fibrillation; Risk                 Factors:Hypertension and Dyslipidemia.  Sonographer:    Carmelita Hartshorn RDCS, FE,  PE Referring Phys: [id number] ALEJANDRO LATIF [facility] IMPRESSIONS  1. Left ventricular ejection fraction, by estimation, is >75%. The left ventricle has hyperdynamic function. The left ventricle has no regional wall motion abnormalities. There is mild left ventricular hypertrophy. Left ventricular diastolic function could not be evaluated due to mitral annular calcification (moderate or greater) and atrial fibrillation but hemodynamics suggestive of Grade II diastolic dysfunction. Elevated left atrial pressure. The E/e' is 17.  2. Right ventricular systolic function is normal. The right ventricular size is normal. There is normal pulmonary artery systolic pressure. The estimated right ventricular systolic pressure is 28.0 mmHg.  3. Left atrial size was severely dilated.  4. The mitral valve is degenerative. No evidence of mitral valve regurgitation. Mild mitral stenosis. The mean mitral valve gradient is 4.0 mmHg with average heart rate of 90 bpm. Severe mitral annular calcification.  5. The aortic valve is tricuspid. There is moderate calcification of the aortic valve. There is moderate thickening of the aortic valve. Aortic valve regurgitation is mild. Mild to moderate aortic valve stenosis.  6. The inferior vena cava is normal in size with greater than 50% respiratory variability, suggesting right atrial pressure of 3 mmHg. Comparison(s): A prior study was performed on 12/08/2019. Mild MS (new) and mild AS is now mild to moderate. FINDINGS  Left Ventricle: Left ventricular ejection fraction, by estimation, is >75%. The left ventricle has hyperdynamic function. The left ventricle has no regional wall motion abnormalities. The left ventricular internal cavity size was normal in size. There is mild left ventricular hypertrophy. Left ventricular diastolic function could not be evaluated due to nondiagnostic images. Left ventricular diastolic function could not be evaluated due to mitral annular calcification (moderate or  greater) and atrial fibrillation but hemodynamics suggestive of Grade II diastolic dysfunction. Elevated left atrial pressure. The E/e' is 95. Right Ventricle: The right ventricular size is normal. No increase in right ventricular wall thickness. Right ventricular systolic function is normal. There  is normal pulmonary artery systolic pressure. The tricuspid regurgitant velocity is 2.50 m/s, and  with an assumed right atrial pressure of 3 mmHg, the estimated right ventricular systolic pressure is 28.0 mmHg. Left Atrium: Left atrial size was severely dilated. Right Atrium: Right atrial size was normal in size. Pericardium: There is no evidence of pericardial effusion. Mitral Valve: The mitral valve is degenerative in appearance. Mildly decreased mobility of the mitral valve leaflets. Severe mitral annular calcification. No evidence of mitral valve regurgitation. Mild mitral valve stenosis. MV peak gradient, 7.9 mmHg. The mean mitral valve gradient is 4.0 mmHg with average heart rate of 90 bpm. Tricuspid Valve: The tricuspid valve is grossly normal. Tricuspid valve regurgitation is mild. Aortic Valve: The aortic valve is tricuspid. There is moderate calcification of the aortic valve. There is moderate thickening of the aortic valve. Aortic valve regurgitation is mild. Mild to moderate aortic stenosis is present. Aortic valve mean gradient measures 14.8 mmHg. Aortic valve peak gradient measures 29.1 mmHg. Aortic valve area, by VTI measures 1.08 cm. Pulmonic Valve: The pulmonic valve was grossly normal. Pulmonic valve regurgitation is not visualized. No evidence of pulmonic stenosis. Aorta: The aortic root and ascending aorta are structurally normal, with no evidence of dilitation. Venous: The inferior vena cava is normal in size with greater than 50% respiratory variability, suggesting right atrial pressure of 3 mmHg. IAS/Shunts: The interatrial septum was not well visualized.  LEFT VENTRICLE PLAX 2D LVIDd:          3.95 cm   Diastology LVIDs:         2.17 cm   LV e' medial:    5.98 cm/s LV PW:         1.33 cm   LV E/e' medial:  19.7 LV IVS:        1.34 cm   LV e' lateral:   7.18 cm/s LVOT diam:     2.01 cm   LV E/e' lateral: 16.4 LV SV:         47 LV SV Index:   24 LVOT Area:     3.17 cm  RIGHT VENTRICLE RV S prime:     23.10 cm/s TAPSE (M-mode): 1.6 cm LEFT ATRIUM              Index        RIGHT ATRIUM           Index LA diam:        4.11 cm  2.09 cm/m   RA Area:     16.80 cm LA Vol (A2C):   128.0 ml 65.01 ml/m  RA Volume:   40.40 ml  20.52 ml/m LA Vol (A4C):   89.6 ml  45.51 ml/m LA Biplane Vol: 111.0 ml 56.37 ml/m  AORTIC VALVE AV Area (Vmax):    1.32 cm AV Area (Vmean):   1.47 cm AV Area (VTI):     1.08 cm AV Vmax:           269.60 cm/s AV Vmean:          174.600 cm/s AV VTI:            0.431 m AV Peak Grad:      29.1 mmHg AV Mean Grad:      14.8 mmHg LVOT Vmax:         112.00 cm/s LVOT Vmean:        81.100 cm/s LVOT VTI:          0.147 m LVOT/AV  VTI ratio: 0.34  AORTA Ao Root diam: 3.11 cm Ao Asc diam:  3.41 cm MITRAL VALVE                TRICUSPID VALVE MV Area (PHT): 4.46 cm     TR Peak grad:   25.0 mmHg MV Area VTI:   1.74 cm     TR Vmax:        250.00 cm/s MV Peak grad:  7.9 mmHg MV Mean grad:  4.0 mmHg     SHUNTS MV Vmax:       1.40 m/s     Systemic VTI:  0.15 m MV Vmean:      86.6 cm/s    Systemic Diam: 2.01 cm MV Decel Time: 170 msec MV E velocity: 118.00 cm/s Sunit Tolia Electronically signed by Madonna Large Signature Date/Time: 10/30/2023/5:46:01 PM    Final    Microbiology: Results for orders placed or performed during the hospital encounter of 10/28/23  MRSA Next Gen by PCR, Nasal     Status: None   Collection Time: 10/29/23  6:16 AM   Specimen: Nasal Mucosa; Nasal Swab  Result Value Ref Range Status   MRSA by PCR Next Gen NOT DETECTED NOT DETECTED Final    Comment: (NOTE) The GeneXpert MRSA Assay (FDA approved for NASAL specimens only), is one component of a comprehensive MRSA colonization  surveillance program. It is not intended to diagnose MRSA infection nor to guide or monitor treatment for MRSA infections. Test performance is not FDA approved in patients less than 43 years old. Performed at [facility], [address], [place], [place] [address]    Labs: CBC: Recent Labs  Lab 10/28/23 2057 10/30/23 0832  WBC 7.3 6.7  NEUTROABS 4.1 3.8  HGB 14.0 14.3  HCT 42.1 44.2  MCV 91.5 92.7  PLT 230 236   Basic Metabolic Panel: Recent Labs  Lab 10/28/23 2057 10/30/23 0832  NA 139 142  K 4.4 4.3  CL 105 105  CO2 20* 27  GLUCOSE 176* 116*  BUN 22 14  CREATININE 0.87 0.93  CALCIUM  9.5 9.1  MG  --  2.0   Liver Function Tests: Recent Labs  Lab 10/28/23 2057 10/30/23 0832  AST 35 27  ALT 18 18  ALKPHOS 100 78  BILITOT 0.4 0.8  PROT 7.3 6.5  ALBUMIN 4.2 3.3*   CBG: No results for input(s): GLUCAP in the last 168 hours.  Discharge time spent: greater than 30 minutes.  Signed: Alejandro Marker, DO [facility] 10/30/2023

## 2023-10-30 NOTE — Progress Notes (Signed)
 Physical Therapy Note  Spoke with occupational therapy after their initial evaluation. OT reports patient is functioning at a high level of independence and no physical therapy is indicated at this time. Patient ambulating around room independently. Confirmed with patient myself after speaking with her and declines formal PT evaluation. Feels close to baseline with a-fib symptoms, active at home. PT is signing-off. Please re-order if there is any significant change in status. Thank you for this referral.  Leontine Roads, PT, DPT [facility]  Rehabilitation Services Physical Therapist Office: [phone] Website: [email or web address]

## 2023-10-30 NOTE — Progress Notes (Addendum)
 Case reviewed with attending MD. Tele reviewed, patient has rate controlled AF on current regimen - flecainide, coreg , diltiazem  and eliquis  for stroke prophylaxis.  Hemodynamically stable. Unfortunately, we can not get her in sooner than her already scheduled ablation on 11/14/23.  Reviewed with patient who indicates understanding and offered assistance if she should need EP in [place].   No charge visit.     Daphne Barrack, NP-C, AGACNP-BC [facility] - Electrophysiology  10/30/2023, 1:06 PM

## 2023-10-30 NOTE — Plan of Care (Signed)
  Problem: Education: Goal: Knowledge of General Education information will improve Description: Including pain rating scale, medication(s)/side effects and non-pharmacologic comfort measures Outcome: Progressing   Problem: Health Behavior/Discharge Planning: Goal: Ability to manage health-related needs will improve Outcome: Progressing   Problem: Clinical Measurements: Goal: Will remain free from infection Outcome: Progressing Goal: Diagnostic test results will improve Outcome: Progressing   Problem: Activity: Goal: Risk for activity intolerance will decrease Outcome: Progressing   Problem: Nutrition: Goal: Adequate nutrition will be maintained Outcome: Progressing   Problem: Coping: Goal: Level of anxiety will decrease Outcome: Progressing

## 2023-10-30 NOTE — TOC CM/SW Note (Signed)
 Transition of Care [facility]) - Inpatient Brief Assessment   Patient Details  Name: Stephanie Rodgers MRN: [id number] Date of Birth: 05-27-1940  Transition of Care [facility]) CM/SW Contact:    Lauraine FORBES Saa, LCSWA Phone Number: 10/30/2023, 10:04 AM   Clinical Narrative:  10:05 AM Per chart review, patient resides at home with spouse and child(ren). Patient has a PCP and insurance. Patient does not have SNF history. Patient has HH history with [facility]. Patient has DME (BSC, rolling walker, CPAP/auto-PAP) history with [facility] and [facility]. Patient's preferred pharmacy's are [facility] and [facility]. No TOC needs were identified at this time. TOC will continue to follow and be available to assist.  Transition of Care Asessment: Insurance and Status: Insurance coverage has been reviewed Patient has primary care physician: Yes Home environment has been reviewed: Private Residence Prior level of function:: N/A Prior/Current Home Services: No current home services (Has HH/DME history) Social Drivers of Health Review: SDOH reviewed no interventions necessary Readmission risk has been reviewed: Yes (Currently Observation Status) Transition of care needs: no transition of care needs at this time

## 2023-10-30 NOTE — Care Management Obs Status (Signed)
 MEDICARE OBSERVATION STATUS NOTIFICATION   Patient Details  Name: Stephanie Rodgers MRN: [id number] Date of Birth: 1940-07-19   Medicare Observation Status Notification Given:  Yes  Verbally reviewed observation notice with Victor Corrente telephonically at [phone]- Claretta Deed 10/30/2023, 11:20 AM  Claretta Deed 10/30/2023, 11:28 AM

## 2023-12-21 ENCOUNTER — Other Ambulatory Visit: Payer: Self-pay

## 2023-12-21 ENCOUNTER — Encounter (HOSPITAL_BASED_OUTPATIENT_CLINIC_OR_DEPARTMENT_OTHER): Payer: Self-pay

## 2023-12-21 ENCOUNTER — Emergency Department (HOSPITAL_BASED_OUTPATIENT_CLINIC_OR_DEPARTMENT_OTHER)

## 2023-12-21 ENCOUNTER — Observation Stay (HOSPITAL_BASED_OUTPATIENT_CLINIC_OR_DEPARTMENT_OTHER)
Admission: EM | Admit: 2023-12-21 | Discharge: 2023-12-24 | Disposition: A | Discharge status: Home / Self Care | Attending: Internal Medicine | Admitting: Internal Medicine

## 2023-12-21 DIAGNOSIS — N289 Disorder of kidney and ureter, unspecified: Secondary | ICD-10-CM | POA: Diagnosis not present

## 2023-12-21 DIAGNOSIS — I480 Paroxysmal atrial fibrillation: Secondary | ICD-10-CM | POA: Insufficient documentation

## 2023-12-21 DIAGNOSIS — L03211 Cellulitis of face: Secondary | ICD-10-CM | POA: Diagnosis not present

## 2023-12-21 DIAGNOSIS — R7303 Prediabetes: Secondary | ICD-10-CM | POA: Diagnosis not present

## 2023-12-21 DIAGNOSIS — Z79899 Other long term (current) drug therapy: Secondary | ICD-10-CM | POA: Diagnosis not present

## 2023-12-21 DIAGNOSIS — N179 Acute kidney failure, unspecified: Secondary | ICD-10-CM | POA: Diagnosis not present

## 2023-12-21 DIAGNOSIS — Z9889 Other specified postprocedural states: Secondary | ICD-10-CM | POA: Diagnosis not present

## 2023-12-21 DIAGNOSIS — Z7901 Long term (current) use of anticoagulants: Secondary | ICD-10-CM | POA: Insufficient documentation

## 2023-12-21 DIAGNOSIS — Z880 Allergy status to penicillin: Secondary | ICD-10-CM | POA: Insufficient documentation

## 2023-12-21 DIAGNOSIS — Z8673 Personal history of transient ischemic attack (TIA), and cerebral infarction without residual deficits: Secondary | ICD-10-CM | POA: Diagnosis not present

## 2023-12-21 DIAGNOSIS — I10 Essential (primary) hypertension: Secondary | ICD-10-CM | POA: Diagnosis not present

## 2023-12-21 DIAGNOSIS — R220 Localized swelling, mass and lump, head: Secondary | ICD-10-CM | POA: Diagnosis present

## 2023-12-21 LAB — COMPREHENSIVE METABOLIC PANEL WITH GFR
ALT: 24 U/L (ref 0–44)
AST: 54 U/L — ABNORMAL HIGH (ref 15–41)
Albumin: 3.9 g/dL (ref 3.5–5.0)
Alkaline Phosphatase: 111 U/L (ref 38–126)
Anion gap: 14 (ref 5–15)
BUN: 24 mg/dL — ABNORMAL HIGH (ref 8–23)
CO2: 22 mmol/L (ref 22–32)
Calcium: 9.2 mg/dL (ref 8.9–10.3)
Chloride: 101 mmol/L (ref 98–111)
Creatinine, Ser: 1.03 mg/dL — ABNORMAL HIGH (ref 0.44–1.00)
GFR, Estimated: 54 mL/min — ABNORMAL LOW (ref >60)
Glucose, Bld: 145 mg/dL — ABNORMAL HIGH (ref 70–99)
Potassium: 4.3 mmol/L (ref 3.5–5.1)
Sodium: 137 mmol/L (ref 135–145)
Total Bilirubin: 0.4 mg/dL (ref 0.0–1.2)
Total Protein: 7.2 g/dL (ref 6.5–8.1)

## 2023-12-21 LAB — PROTIME-INR
INR: 1.3 — ABNORMAL HIGH (ref 0.8–1.2)
Prothrombin Time: 17.4 s — ABNORMAL HIGH (ref 11.4–15.2)

## 2023-12-21 LAB — CBC WITH DIFFERENTIAL/PLATELET
Abs Immature Granulocytes: 0.02 K/uL (ref 0.00–0.07)
Basophils Absolute: 0 K/uL (ref 0.0–0.1)
Basophils Relative: 0 %
Eosinophils Absolute: 0 K/uL (ref 0.0–0.5)
Eosinophils Relative: 0 %
HCT: 38.5 % (ref 36.0–46.0)
Hemoglobin: 12.8 g/dL (ref 12.0–15.0)
Immature Granulocytes: 0 %
Lymphocytes Relative: 10 %
Lymphs Abs: 0.5 K/uL — ABNORMAL LOW (ref 0.7–4.0)
MCH: 30.5 pg (ref 26.0–34.0)
MCHC: 33.2 g/dL (ref 30.0–36.0)
MCV: 91.9 fL (ref 80.0–100.0)
Monocytes Absolute: 0.3 K/uL (ref 0.1–1.0)
Monocytes Relative: 6 %
Neutro Abs: 4.1 K/uL (ref 1.7–7.7)
Neutrophils Relative %: 84 %
Platelets: 186 K/uL (ref 150–400)
RBC: 4.19 MIL/uL (ref 3.87–5.11)
RDW: 14.8 % (ref 11.5–15.5)
WBC: 5 K/uL (ref 4.0–10.5)
nRBC: 0 % (ref 0.0–0.2)

## 2023-12-21 LAB — LACTIC ACID, PLASMA: Lactic Acid, Venous: 0.9 mmol/L (ref 0.5–1.9)

## 2023-12-21 MED ORDER — SODIUM CHLORIDE 0.9 % IV BOLUS
500.0000 mL | Freq: Once | INTRAVENOUS | Status: AC
Start: 2023-12-21 — End: 2023-12-22
  Administered 2023-12-21: 500 mL via INTRAVENOUS

## 2023-12-21 MED ORDER — IOHEXOL 300 MG/ML  SOLN
75.0000 mL | Freq: Once | INTRAMUSCULAR | Status: AC | PRN
  Administered 2023-12-21: 75 mL via INTRAVENOUS

## 2023-12-21 MED ORDER — ACETAMINOPHEN 325 MG PO TABS
650.0000 mg | ORAL_TABLET | Freq: Once | ORAL | Status: AC
  Administered 2023-12-21: 650 mg via ORAL
  Filled 2023-12-21: qty 2

## 2023-12-21 MED ORDER — FENTANYL CITRATE PF 50 MCG/ML IJ SOSY
50.0000 ug | PREFILLED_SYRINGE | Freq: Once | INTRAMUSCULAR | Status: AC
  Administered 2023-12-21: 50 ug via INTRAVENOUS
  Filled 2023-12-21: qty 1

## 2023-12-21 MED ORDER — METRONIDAZOLE 500 MG/100ML IV SOLN
500.0000 mg | Freq: Once | INTRAVENOUS | Status: AC
Start: 2023-12-21 — End: 2023-12-22
  Administered 2023-12-21: 500 mg via INTRAVENOUS
  Filled 2023-12-21: qty 100

## 2023-12-21 MED ORDER — ONDANSETRON HCL 4 MG/2ML IJ SOLN
4.0000 mg | Freq: Once | INTRAMUSCULAR | Status: AC
  Administered 2023-12-21: 4 mg via INTRAVENOUS
  Filled 2023-12-21: qty 2

## 2023-12-21 MED ORDER — SODIUM CHLORIDE 0.9 % IV SOLN
1.0000 g | Freq: Once | INTRAVENOUS | Status: AC
  Administered 2023-12-21: 1 g via INTRAVENOUS
  Filled 2023-12-21: qty 10

## 2023-12-21 NOTE — ED Notes (Addendum)
 Brought pt back in triage to reasses her and check vitals  Dtg states she is progressively become worse Increased pain Tylenol  given

## 2023-12-21 NOTE — ED Notes (Signed)
 Patient transported to CT

## 2023-12-21 NOTE — ED Triage Notes (Addendum)
 Will order labs

## 2023-12-21 NOTE — ED Notes (Signed)
 ED Provider at bedside.

## 2023-12-21 NOTE — ED Triage Notes (Addendum)
 Had a tooth extracted on 12/20/23 She  has been on clindamycin since Pain and swelling more to left lower jaw

## 2023-12-21 NOTE — ED Provider Notes (Signed)
 [place] [facility] Provider Note   CSN: [id number] Arrival date & time: 12/21/23  1927     Patient presents with: Facial Swelling and Fever   Stephanie Rodgers is a 83 y.o. female.   Patient is a 83 year old female who presents with fever and jaw pain.  She has a history of hypertension, atrial fibrillation on Eliquis , restless leg syndrome. She has a known heart murmur.  She has been having trouble with her left lower tooth for several days.  She has been seeing her doctor.  She was started on clindamycin for the last several days.  With the increased pain, her dentist had increased the dose from 150 mg every 6 hours to 300 mg every 6 hours.  They went back to the dentist yesterday and she had a tooth extracted.  Since then, she has been doing worse.  She has had fever up to 101.  She has had worsening swelling to the face and pain.  Her daughter states that the dentist did an x-ray and said they did not see any sign of an abscess.  This was yesterday.  She denies any cough or cold symptoms.  No urinary symptoms.  She has had some nausea but no vomiting or diarrhea.       Prior to Admission medications   Medication Sig Start Date End Date Taking? Authorizing Provider  acetaminophen  (TYLENOL ) 500 MG tablet Take 1,000 mg by mouth every 6 (six) hours as needed for mild pain, fever or headache.    [provider]  apixaban  (ELIQUIS ) 5 MG TABS tablet Take 1 tablet (5 mg total) by mouth 2 (two) times daily. 12/11/19   Noemi Reena CROME, NP  carvedilol  (COREG ) 12.5 MG tablet Take 12.5-25 mg by mouth See admin instructions. 2qam 1qpm    [provider]  Cholecalciferol  (VITAMIN D3 PO) Take 1 tablet by mouth daily.    [provider]  clindamycin (CLEOCIN) 300 MG capsule Take 300 mg by mouth 3 (three) times daily. 10/24/23   [provider]  diltiazem  (CARDIZEM  CD) 120 MG 24 hr capsule Take 120 mg by mouth daily. 10/02/23   [provider]  irbesartan  (AVAPRO ) 150 MG tablet Take 150 mg by mouth daily as needed (HBP). 07/28/23   [provider]  loratadine-pseudoephedrine (CLARITIN-D 24-HOUR) 10-240 MG 24 hr tablet Take 1 tablet by mouth daily.    [provider]  mirtazapine (REMERON) 30 MG tablet Take 30 mg by mouth at bedtime. Patient not taking: Reported on 10/29/2023 08/28/23   [provider]  mupirocin ointment (BACTROBAN) 2 % Apply 1 Application topically 2 (two) times daily. 10/24/23   [provider]  rosuvastatin  (CRESTOR ) 40 MG tablet Take 40 mg by mouth at bedtime. 12/24/20   [provider]  sodium chloride  (MURO 128) 2 % ophthalmic solution Place 1 drop into both eyes at bedtime.    [provider]  tiZANidine (ZANAFLEX) 2 MG tablet Take 2 mg by mouth 2 (two) times daily as needed for muscle spasms. 10/13/23   [provider]  zolpidem  (AMBIEN ) 10 MG tablet Take 10 mg by mouth at bedtime. 10/14/23   [provider]    Allergies: Alendronate, Ciprofloxacin, Hydrochlorothiazide, Penicillins, Amiodarone, and Colesevelam    Review of Systems  Constitutional:  Positive for chills, fatigue and fever. Negative for diaphoresis.  HENT:  Positive for facial swelling. Negative for congestion, rhinorrhea and sneezing.   Eyes: Negative.  Respiratory:  Negative for cough, chest tightness and shortness of breath.   Cardiovascular:  Negative for chest pain and leg swelling.  Gastrointestinal:  Positive for nausea. Negative for abdominal pain, diarrhea and vomiting.  Genitourinary:  Negative for difficulty urinating, flank pain and frequency.  Musculoskeletal:  Negative for arthralgias and back pain.  Skin:  Negative for rash.  Neurological:  Negative for dizziness, speech difficulty, weakness, numbness and headaches.    Updated Vital Signs BP (!) 130/49   Pulse 69   Temp 99.6 F (37.6 C) (Oral)   Resp (!) 22   Ht 5' 4 (1.626 m)   Wt 86.2 kg    SpO2 94%   BMI 32.61 kg/m   Physical Exam Constitutional:      Appearance: She is well-developed.  HENT:     Head: Normocephalic and atraumatic.     Mouth/Throat:     Comments: Positive swelling adjacent to the gumline to the left lower jaw.  There are some sutures in place from her prior dental extraction.  No bleeding.  No trismus.  No elevation of the tongue. Eyes:     Pupils: Pupils are equal, round, and reactive to light.  Cardiovascular:     Rate and Rhythm: Normal rate and regular rhythm.     Heart sounds: Murmur heard.  Pulmonary:     Effort: Pulmonary effort is normal. No respiratory distress.     Breath sounds: Normal breath sounds. No wheezing or rales.  Chest:     Chest wall: No tenderness.  Abdominal:     General: Bowel sounds are normal.     Palpations: Abdomen is soft.     Tenderness: There is no abdominal tenderness. There is no guarding or rebound.  Musculoskeletal:        General: Normal range of motion.     Cervical back: Normal range of motion and neck supple.  Lymphadenopathy:     Cervical: No cervical adenopathy.  Skin:    General: Skin is warm and dry.     Findings: No rash.  Neurological:     Mental Status: She is alert and oriented to person, place, and time.     (all labs ordered are listed, but only abnormal results are displayed) Labs Reviewed  CBC WITH DIFFERENTIAL/PLATELET - Abnormal; Notable for the following components:      Result Value   Lymphs Abs 0.5 (*)    All other components within normal limits  COMPREHENSIVE METABOLIC PANEL WITH GFR - Abnormal; Notable for the following components:   Glucose, Bld 145 (*)    BUN 24 (*)    Creatinine, Ser 1.03 (*)    AST 54 (*)    GFR, Estimated 54 (*)    All other components within normal limits  PROTIME-INR - Abnormal; Notable for the following components:   Prothrombin Time 17.4 (*)    INR 1.3 (*)    All other components within normal limits  CULTURE, BLOOD (ROUTINE X 2)  CULTURE,  BLOOD (ROUTINE X 2)  LACTIC ACID, PLASMA  LACTIC ACID, PLASMA  URINALYSIS, W/ REFLEX TO CULTURE (INFECTION SUSPECTED)    EKG: None  Radiology: CT Maxillofacial W Contrast Result Date: 12/21/2023 EXAM: CT Face with contrast 12/21/2023 11:21:07 PM TECHNIQUE: CT of the face was performed with the administration of 75 mL of iohexol  (OMNIPAQUE ) 300 MG/ML solution. Multiplanar reformatted images are provided for review. Automated exposure control, iterative reconstruction, and/or weight based adjustment of the mA/kV was utilized to reduce the radiation  dose to as low as reasonably achievable. COMPARISON: None available CLINICAL HISTORY: Swelling to left mandible, assess for underlying infection. 83 y/o female had left lower tooth extracted 10/01. Has been on antibiotics since. Increased pain/swelling today. FINDINGS: AERODIGESTIVE TRACT: No mass. No edema. SALIVARY GLANDS: No acute abnormality. LYMPH NODES: No suspicious cervical lymphadenopathy. SOFT TISSUES: No mass or fluid collection. There is soft tissue swelling at the left mandibular body where there is also the empty socket of tooth 21. There is no abscess or drainable fluid collection. BRAIN, ORBITS AND SINUSES: No acute abnormality. Ocular lens replacements. Leftward deviation of the nasal septum with leftward projecting osseous spur without turbinate contact. BONES: No acute abnormality. No suspicious bone lesion. Empty socket of tooth 21. IMPRESSION: 1. Soft tissue swelling at the left mandibular body at the site of the empty socket of tooth 21, without abscess or drainable fluid collection. Electronically signed by: Franky Stanford MD 12/21/2023 11:27 PM EDT RP Workstation: [id number]     Procedures   Medications Ordered in the ED  metroNIDAZOLE (FLAGYL) IVPB 500 mg (has no administration in time range)  acetaminophen  (TYLENOL ) tablet 650 mg (650 mg Oral Given 12/21/23 2133)  sodium chloride  0.9 % bolus 500 mL (500 mLs Intravenous New  Bag/Given 12/21/23 2242)  fentaNYL (SUBLIMAZE) injection 50 mcg (50 mcg Intravenous Given 12/21/23 2241)  ondansetron  (ZOFRAN ) injection 4 mg (4 mg Intravenous Given 12/21/23 2240)  cefTRIAXone (ROCEPHIN) 1 g in sodium chloride  0.9 % 100 mL IVPB (1 g Intravenous New Bag/Given 12/21/23 2304)  iohexol  (OMNIPAQUE ) 300 MG/ML solution 75 mL (75 mLs Intravenous Contrast Given 12/21/23 2309)                                    Medical Decision Making Amount and/or Complexity of Data Reviewed Labs: ordered. Radiology: ordered.  Risk OTC drugs. Prescription drug management. Decision regarding hospitalization.   Patient is a 83 year old who presents with worsening facial swelling after treatment for recent dental infection.  She has been on clindamycin for 5 days and she had a tooth extracted yesterday.  She has developed increased swelling to her jaw and fever.  Her WBC count is normal.  Lactic acid is normal, no clinical suggestions of sepsis.  She is feeling better after IV fluids.  She was given Rocephin and Flagyl after consultation with pharmacist in the ED.  She has a penicillin allergy and she has already been on clindamycin so did not seem useful to give her IV clinda.  She had a CT scan which shows some localized swelling to jaw but no drainable abscess.  Feel the patient needs to be admitted for IV antibiotics given her worsening symptoms despite being on clindamycin and otherwise limited outpatient choices.  Will consult the hospitalist.  Dr. Carita to take call from the hospitalist.     Final diagnoses:  Facial cellulitis    ED Discharge Orders     None          Lenor Hollering, MD 12/21/23 2347

## 2023-12-22 DIAGNOSIS — R220 Localized swelling, mass and lump, head: Secondary | ICD-10-CM | POA: Diagnosis not present

## 2023-12-22 DIAGNOSIS — Z7401 Bed confinement status: Secondary | ICD-10-CM | POA: Diagnosis not present

## 2023-12-22 DIAGNOSIS — L03211 Cellulitis of face: Secondary | ICD-10-CM | POA: Diagnosis present

## 2023-12-22 LAB — URINALYSIS, W/ REFLEX TO CULTURE (INFECTION SUSPECTED)
Bilirubin Urine: NEGATIVE
Glucose, UA: NEGATIVE mg/dL
Ketones, ur: NEGATIVE mg/dL
Leukocytes,Ua: NEGATIVE
Nitrite: NEGATIVE
Protein, ur: 100 mg/dL — AB
Specific Gravity, Urine: 1.015 (ref 1.005–1.030)
pH: 5.5 (ref 5.0–8.0)

## 2023-12-22 LAB — HEMOGLOBIN A1C
Hgb A1c MFr Bld: 6 % — ABNORMAL HIGH (ref 4.8–5.6)
Mean Plasma Glucose: 125.5 mg/dL

## 2023-12-22 MED ORDER — VANCOMYCIN HCL IN DEXTROSE 1-5 GM/200ML-% IV SOLN
1000.0000 mg | INTRAVENOUS | Status: DC
  Administered 2023-12-23: 1000 mg via INTRAVENOUS
  Filled 2023-12-22: qty 200

## 2023-12-22 MED ORDER — VANCOMYCIN HCL IN DEXTROSE 1-5 GM/200ML-% IV SOLN: 1000.0000 mg | Freq: Once | INTRAVENOUS | Status: DC

## 2023-12-22 MED ORDER — ONDANSETRON HCL 4 MG/2ML IJ SOLN
4.0000 mg | Freq: Four times a day (QID) | INTRAMUSCULAR | Status: DC | PRN
  Administered 2023-12-24: 4 mg via INTRAVENOUS
  Filled 2023-12-22: qty 2

## 2023-12-22 MED ORDER — SODIUM CHLORIDE 0.9 % IV SOLN
1.0000 g | Freq: Two times a day (BID) | INTRAVENOUS | Status: DC
  Administered 2023-12-22 – 2023-12-23 (×2): 1 g via INTRAVENOUS
  Filled 2023-12-22 (×3): qty 20

## 2023-12-22 MED ORDER — CARVEDILOL 12.5 MG PO TABS: 12.5000 mg | ORAL_TABLET | ORAL | Status: DC

## 2023-12-22 MED ORDER — LACTATED RINGERS IV SOLN: INTRAVENOUS | Status: AC

## 2023-12-22 MED ORDER — VANCOMYCIN HCL 1250 MG/250ML IV SOLN
1250.0000 mg | Freq: Once | INTRAVENOUS | Status: AC
  Administered 2023-12-22: 1250 mg via INTRAVENOUS
  Filled 2023-12-22 (×2): qty 250

## 2023-12-22 MED ORDER — ROSUVASTATIN CALCIUM 20 MG PO TABS
40.0000 mg | ORAL_TABLET | Freq: Every day | ORAL | Status: DC
  Administered 2023-12-22 – 2023-12-24 (×3): 40 mg via ORAL
  Filled 2023-12-22 (×3): qty 2

## 2023-12-22 MED ORDER — FENTANYL CITRATE PF 50 MCG/ML IJ SOSY
50.0000 ug | PREFILLED_SYRINGE | Freq: Once | INTRAMUSCULAR | Status: AC
Start: 2023-12-22 — End: 2023-12-22
  Administered 2023-12-22: 50 ug via INTRAVENOUS
  Filled 2023-12-22: qty 1

## 2023-12-22 MED ORDER — POLYETHYLENE GLYCOL 3350 17 G PO PACK: 17.0000 g | PACK | Freq: Every day | ORAL | Status: DC | PRN

## 2023-12-22 MED ORDER — METRONIDAZOLE 500 MG/100ML IV SOLN
500.0000 mg | Freq: Two times a day (BID) | INTRAVENOUS | Status: DC
  Administered 2023-12-22 – 2023-12-23 (×3): 500 mg via INTRAVENOUS
  Filled 2023-12-22 (×3): qty 100

## 2023-12-22 MED ORDER — HYDRALAZINE HCL 20 MG/ML IJ SOLN: 5.0000 mg | INTRAMUSCULAR | Status: DC | PRN

## 2023-12-22 MED ORDER — ONDANSETRON HCL 4 MG PO TABS: 4.0000 mg | ORAL_TABLET | Freq: Four times a day (QID) | ORAL | Status: DC | PRN

## 2023-12-22 MED ORDER — APIXABAN 5 MG PO TABS
5.0000 mg | ORAL_TABLET | Freq: Two times a day (BID) | ORAL | Status: DC
  Administered 2023-12-22 – 2023-12-24 (×4): 5 mg via ORAL
  Filled 2023-12-22 (×4): qty 1

## 2023-12-22 MED ORDER — CARVEDILOL 3.125 MG PO TABS
3.1250 mg | ORAL_TABLET | Freq: Every evening | ORAL | Status: DC
  Administered 2023-12-22: 3.125 mg via ORAL
  Filled 2023-12-22: qty 1

## 2023-12-22 MED ORDER — SODIUM CHLORIDE 0.9 % IV SOLN
2.0000 g | Freq: Two times a day (BID) | INTRAVENOUS | Status: DC
  Administered 2023-12-22: 2 g via INTRAVENOUS
  Filled 2023-12-22: qty 12.5

## 2023-12-22 MED ORDER — DILTIAZEM HCL ER COATED BEADS 120 MG PO CP24
120.0000 mg | ORAL_CAPSULE | Freq: Every day | ORAL | Status: DC
Start: 2023-12-22 — End: 2023-12-24
  Administered 2023-12-22 – 2023-12-24 (×3): 120 mg via ORAL
  Filled 2023-12-22 (×3): qty 1

## 2023-12-22 MED ORDER — IRBESARTAN 75 MG PO TABS
150.0000 mg | ORAL_TABLET | Freq: Every day | ORAL | Status: DC | PRN
Start: 2023-12-22 — End: 2023-12-22

## 2023-12-22 MED ORDER — ACETAMINOPHEN 650 MG RE SUPP: 650.0000 mg | Freq: Four times a day (QID) | RECTAL | Status: DC | PRN

## 2023-12-22 MED ORDER — DIPHENHYDRAMINE HCL 25 MG PO CAPS
25.0000 mg | ORAL_CAPSULE | Freq: Once | ORAL | Status: AC
  Administered 2023-12-22: 25 mg via ORAL
  Filled 2023-12-22: qty 1

## 2023-12-22 MED ORDER — BACID PO TABS: 2.0000 | ORAL_TABLET | Freq: Three times a day (TID) | ORAL | Status: DC

## 2023-12-22 MED ORDER — CARVEDILOL 25 MG PO TABS
25.0000 mg | ORAL_TABLET | Freq: Every day | ORAL | Status: DC
  Administered 2023-12-23: 25 mg via ORAL
  Filled 2023-12-22: qty 1

## 2023-12-22 MED ORDER — BISACODYL 5 MG PO TBEC: 5.0000 mg | DELAYED_RELEASE_TABLET | Freq: Every day | ORAL | Status: DC | PRN

## 2023-12-22 MED ORDER — RISAQUAD PO CAPS
2.0000 | ORAL_CAPSULE | Freq: Two times a day (BID) | ORAL | Status: DC
  Administered 2023-12-22 – 2023-12-23 (×3): 2 via ORAL
  Filled 2023-12-22 (×4): qty 2

## 2023-12-22 MED ORDER — ACETAMINOPHEN 325 MG PO TABS
650.0000 mg | ORAL_TABLET | Freq: Four times a day (QID) | ORAL | Status: DC | PRN
  Administered 2023-12-22: 650 mg via ORAL
  Filled 2023-12-22: qty 2

## 2023-12-22 NOTE — Plan of Care (Signed)
 ?  Problem: Clinical Measurements: ?Goal: Ability to avoid or minimize complications of infection will improve ?Outcome: Progressing ?  ?Problem: Skin Integrity: ?Goal: Skin integrity will improve ?Outcome: Progressing ?  ?

## 2023-12-22 NOTE — Progress Notes (Signed)
 Pharmacy Antibiotic Note  Stephanie Rodgers is a 83 y.o. female admitted on 12/21/2023 with cellulitis.  Pharmacy has been consulted for Vancomycin / Cefepime dosing.  Plan: Vancomycin 1250 mg iv x 1 dose now then 1 gram iv Q 24 hours Cefepime 2 grams iv Q 12 hours Follow Scr, cultures, progress  Height: 5' 4 (162.6 cm) Weight: 81.6 kg (180 lb) IBW/kg (Calculated) : 54.7  Temp (24hrs), Avg:99.4 F (37.4 C), Min:98 F (36.7 C), Max:101.1 F (38.4 C)  Recent Labs  Lab 12/21/23 2002 12/21/23 2251  WBC 5.0  --   CREATININE 1.03*  --   LATICACIDVEN  --  0.9    Estimated Creatinine Clearance: 43.5 mL/min (A) (by C-G formula based on SCr of 1.03 mg/dL (H)).    Allergies  Allergen Reactions   Alendronate Other (See Comments)    Edema, joint pain   Ciprofloxacin Other (See Comments)    Abdominal pain, fatigue   Hydrochlorothiazide Other (See Comments)    gout   Penicillins Rash and Other (See Comments)    Welts    Amiodarone Other (See Comments)    Dizziness, numbness, fatigue.    Colesevelam Other (See Comments)    unknown    Thank you for allowing pharmacy to be a part of this patient's care. Olam Monte, PharmD 12/22/2023 1:08 PM

## 2023-12-22 NOTE — ED Notes (Signed)
[facility] here to get pt 

## 2023-12-22 NOTE — Care Management Obs Status (Signed)
 MEDICARE OBSERVATION STATUS NOTIFICATION   Patient Details  Name: Stephanie Rodgers MRN: [id number] Date of Birth: 08-10-1940   Medicare Observation Status Notification Given:  Yes  Verbally reviewed observation notice with Rosina Neer  telephonically at [phone].    Hargun Spurling 12/22/2023, 3:15 PM

## 2023-12-22 NOTE — ED Notes (Signed)
 Pt ambulated to restroom.

## 2023-12-22 NOTE — ED Notes (Signed)
 Introduced myself to pt , room cleaned  and pt moved to recliner as she staes she was uncomfortable in bed , pt given gingerale, swallowing w/o diff call bell in reach

## 2023-12-22 NOTE — H&P (Signed)
 History and Physical    Patient: Stephanie Rodgers FMW:[id number] DOB: 1941-03-12 DOA: 12/21/2023 DOS: the patient was seen and examined on 12/22/2023 . PCP: Norleen Nohemi Shuck, MD  Patient coming from: Home Chief complaint: Chief Complaint  Patient presents with   Facial Swelling   Fever   HPI:  Stephanie Rodgers is a 83 y.o. female with past medical history  of prediabetes obesity with a BMI of 30.90, allergies to alendronate ciprofloxacin HCTZ penicillin amiodarone, colesevelam, history of paroxysmal atrial fibrillation status post ablation, history of CVA, essential hypertension, admitted for facial swelling and cellulitis since Thursday.  Per daughter at bedside patient had a tooth extraction on  Thursday had streaking of her face she had taken a picture, fevers and chills dentist had called in clindamycin immediately that the patient had taken but it was ineffective.  Admission for failure of outpatient antibiotic regimen for her cellulitis. At bedside patient is alert awake oriented is able to speak no trouble with swallowing or drooling patient passed swallow evaluation and diet is been ordered.  ED Course:  Vital signs in the ED were notable for the following:  Vitals:   12/22/23 0330 12/22/23 0631 12/22/23 0932 12/22/23 1158  BP:  (!) 154/77 136/63 130/74  Pulse: 74 89 84 74  Temp:  99.1 F (37.3 C) 99.1 F (37.3 C) 98 F (36.7 C)  Resp: 20 (!) 22 17 18   Height:    5' 4 (1.626 m)  Weight:    81.6 kg  SpO2: 98% 96% 96% 98%  TempSrc:  Oral Oral Oral  BMI (Calculated):    30.88   >>ED evaluation thus far shows: CMP yesterday showed glucose of 145 BUN 24 creatinine 1.03, AST of 54. Lactic acid is 0.9. CBC within normal limits. Blood cultures collected yesterday x 2. Maxillofacial CT showed soft tissue swelling of the left mandibular body at the site of the empty socket of the tooth without any collection or abscess.   >>While in the ED patient received the  following: Medications  acetaminophen  (TYLENOL ) tablet 650 mg (650 mg Oral Given 12/21/23 2133)  sodium chloride  0.9 % bolus 500 mL (0 mLs Intravenous Stopped 12/22/23 0036)  fentaNYL (SUBLIMAZE) injection 50 mcg (50 mcg Intravenous Given 12/21/23 2241)  ondansetron  (ZOFRAN ) injection 4 mg (4 mg Intravenous Given 12/21/23 2240)  cefTRIAXone (ROCEPHIN) 1 g in sodium chloride  0.9 % 100 mL IVPB (0 g Intravenous Stopped 12/21/23 2334)  metroNIDAZOLE (FLAGYL) IVPB 500 mg (0 mg Intravenous Stopped 12/22/23 0058)  iohexol  (OMNIPAQUE ) 300 MG/ML solution 75 mL (75 mLs Intravenous Contrast Given 12/21/23 2309)  fentaNYL (SUBLIMAZE) injection 50 mcg (50 mcg Intravenous Given 12/22/23 1106)   Review of Systems  Constitutional:  Positive for chills and fever.   Past Medical History:  Diagnosis Date   Atrial fibrillation (HCC)    Depression    Hyperlipidemia    Hypertension    RLS (restless legs syndrome)    Stroke Center [facility])    Past Surgical History:  Procedure Laterality Date   ABDOMINAL HYSTERECTOMY     BACK SURGERY     BREAST SURGERY     CARPAL TUNNEL WITH CUBITAL TUNNEL     CHOLECYSTECTOMY     COLON SURGERY     EYE SURGERY     JOINT REPLACEMENT Bilateral    Knees   SKIN BIOPSY     TONSILLECTOMY      reports that she has never smoked. She has never used smokeless tobacco. She reports that  she does not currently use alcohol. She reports that she does not use drugs. Allergies  Allergen Reactions   Alendronate Other (See Comments)    Edema, joint pain   Ciprofloxacin Other (See Comments)    Abdominal pain, fatigue   Hydrochlorothiazide Other (See Comments)    gout   Penicillins Rash and Other (See Comments)    Welts    Amiodarone Other (See Comments)    Dizziness, numbness, fatigue.    Colesevelam Other (See Comments)    unknown   History reviewed. No pertinent family history. Prior to Admission medications   Medication Sig Start Date End Date Taking? Authorizing Provider   acetaminophen  (TYLENOL ) 500 MG tablet Take 1,000 mg by mouth every 6 (six) hours as needed for mild pain, fever or headache.    [provider]  apixaban  (ELIQUIS ) 5 MG TABS tablet Take 1 tablet (5 mg total) by mouth 2 (two) times daily. 12/11/19   Noemi Reena CROME, NP  carvedilol  (COREG ) 12.5 MG tablet Take 12.5-25 mg by mouth See admin instructions. 2qam 1qpm    [provider]  Cholecalciferol  (VITAMIN D3 PO) Take 1 tablet by mouth daily.    [provider]  clindamycin (CLEOCIN) 300 MG capsule Take 300 mg by mouth 3 (three) times daily. 10/24/23   [provider]  diltiazem  (CARDIZEM  CD) 120 MG 24 hr capsule Take 120 mg by mouth daily. 10/02/23   [provider]  irbesartan  (AVAPRO ) 150 MG tablet Take 150 mg by mouth daily as needed (HBP). 07/28/23   [provider]  loratadine-pseudoephedrine (CLARITIN-D 24-HOUR) 10-240 MG 24 hr tablet Take 1 tablet by mouth daily.    [provider]  mirtazapine (REMERON) 30 MG tablet Take 30 mg by mouth at bedtime. Patient not taking: Reported on 10/29/2023 08/28/23   [provider]  mupirocin ointment (BACTROBAN) 2 % Apply 1 Application topically 2 (two) times daily. 10/24/23   [provider]  rosuvastatin  (CRESTOR ) 40 MG tablet Take 40 mg by mouth at bedtime. 12/24/20   [provider]  sodium chloride  (MURO 128) 2 % ophthalmic solution Place 1 drop into both eyes at bedtime.    [provider]  tiZANidine (ZANAFLEX) 2 MG tablet Take 2 mg by mouth 2 (two) times daily as needed for muscle spasms. 10/13/23   [provider]  zolpidem  (AMBIEN ) 10 MG tablet Take 10 mg by mouth at bedtime. 10/14/23   [provider]                                                                                 Vitals:   12/22/23 0330 12/22/23 0631 12/22/23 0932 12/22/23 1158  BP:  (!) 154/77 136/63 130/74  Pulse: 74 89 84 74  Resp: 20 (!) 22 17 18   Temp:  99.1 F (37.3  C) 99.1 F (37.3 C) 98 F (36.7 C)  TempSrc:  Oral Oral Oral  SpO2: 98% 96% 96% 98%  Weight:    81.6 kg  Height:    5' 4 (1.626 m)   Physical Exam Vitals reviewed.  Constitutional:      General: She is not in acute distress.  Appearance: She is obese. She is not ill-appearing.  HENT:     Head: Normocephalic.  Eyes:     Extraocular Movements: Extraocular movements intact.  Cardiovascular:     Rate and Rhythm: Normal rate and regular rhythm.     Heart sounds: Normal heart sounds.  Pulmonary:     Effort: Pulmonary effort is normal.     Breath sounds: Normal breath sounds.  Abdominal:     General: There is no distension.     Palpations: Abdomen is soft.     Tenderness: There is no abdominal tenderness.  Musculoskeletal:     Right lower leg: No edema.     Left lower leg: No edema.  Neurological:     General: No focal deficit present.     Mental Status: She is alert and oriented to person, place, and time.              Labs on Admission: I have personally reviewed following labs and imaging studies CBC: Recent Labs  Lab 12/21/23 2002  WBC 5.0  NEUTROABS 4.1  HGB 12.8  HCT 38.5  MCV 91.9  PLT 186   Basic Metabolic Panel: Recent Labs  Lab 12/21/23 2002  NA 137  K 4.3  CL 101  CO2 22  GLUCOSE 145*  BUN 24*  CREATININE 1.03*  CALCIUM  9.2   GFR: Estimated Creatinine Clearance: 43.5 mL/min (A) (by C-G formula based on SCr of 1.03 mg/dL (H)). Liver Function Tests: Recent Labs  Lab 12/21/23 2002  AST 54*  ALT 24  ALKPHOS 111  BILITOT 0.4  PROT 7.2  ALBUMIN 3.9   No results for input(s): LIPASE, AMYLASE in the last 168 hours. No results for input(s): AMMONIA in the last 168 hours. Recent Labs    02/14/23 1159 10/28/23 2057 10/30/23 0832 12/21/23 2002  BUN 15 22 14  24*  CREATININE 0.72 0.87 0.93 1.03*    Cardiac Enzymes: No results for input(s): CKTOTAL, CKMB, CKMBINDEX, TROPONINI in the last 168 hours. BNP (last 3  results) No results for input(s): PROBNP in the last 8760 hours. HbA1C: No results for input(s): HGBA1C in the last 72 hours. CBG: No results for input(s): GLUCAP in the last 168 hours. Lipid Profile: No results for input(s): CHOL, HDL, LDLCALC, TRIG, CHOLHDL, LDLDIRECT in the last 72 hours. Thyroid  Function Tests: No results for input(s): TSH, T4TOTAL, FREET4, T3FREE, THYROIDAB in the last 72 hours. Anemia Panel: No results for input(s): VITAMINB12, FOLATE, FERRITIN, TIBC, IRON, RETICCTPCT in the last 72 hours. Urine analysis:    Component Value Date/Time   COLORURINE YELLOW 12/22/2023 0040   APPEARANCEUR CLOUDY (A) 12/22/2023 0040   LABSPEC 1.015 12/22/2023 0040   PHURINE 5.5 12/22/2023 0040   GLUCOSEU NEGATIVE 12/22/2023 0040   HGBUR TRACE (A) 12/22/2023 0040   BILIRUBINUR NEGATIVE 12/22/2023 0040   KETONESUR NEGATIVE 12/22/2023 0040   PROTEINUR 100 (A) 12/22/2023 0040   NITRITE NEGATIVE 12/22/2023 0040   LEUKOCYTESUR NEGATIVE 12/22/2023 0040   Radiological Exams on Admission: CT Maxillofacial W Contrast Result Date: 12/21/2023 EXAM: CT Face with contrast 12/21/2023 11:21:07 PM TECHNIQUE: CT of the face was performed with the administration of 75 mL of iohexol  (OMNIPAQUE ) 300 MG/ML solution. Multiplanar reformatted images are provided for review. Automated exposure control, iterative reconstruction, and/or weight based adjustment of the mA/kV was utilized to reduce the radiation dose to as low as reasonably achievable. COMPARISON: None available CLINICAL HISTORY: Swelling to left mandible, assess for underlying infection. 83 y/o female had left lower tooth  extracted 10/01. Has been on antibiotics since. Increased pain/swelling today. FINDINGS: AERODIGESTIVE TRACT: No mass. No edema. SALIVARY GLANDS: No acute abnormality. LYMPH NODES: No suspicious cervical lymphadenopathy. SOFT TISSUES: No mass or fluid collection. There is soft tissue swelling  at the left mandibular body where there is also the empty socket of tooth 21. There is no abscess or drainable fluid collection. BRAIN, ORBITS AND SINUSES: No acute abnormality. Ocular lens replacements. Leftward deviation of the nasal septum with leftward projecting osseous spur without turbinate contact. BONES: No acute abnormality. No suspicious bone lesion. Empty socket of tooth 21. IMPRESSION: 1. Soft tissue swelling at the left mandibular body at the site of the empty socket of tooth 21, without abscess or drainable fluid collection. Electronically signed by: Franky Stanford MD 12/21/2023 11:27 PM EDT RP Workstation: [id number]   Data Reviewed: Relevant notes from primary care and specialist visits, past discharge summaries as available in EHR, including Care Everywhere . Prior diagnostic testing as pertinent to current admission diagnoses, Updated medications and problem lists for reconciliation .ED course, including vitals, labs, imaging, treatment and response to treatment,Triage notes, nursing and pharmacy notes and ED provider's notes.Notable results as noted in HPI.Discussed case with EDMD/ ED APP/ or Specialty MD on call and as needed.  Assessment & Plan    >> Facial cellulitis: Admitted for IV antibiotics and broad-spectrum coverage monitoring. IV fluids due to decreased p.o. intake due to swelling and pain. Antiemetics antipyretics IV PPI as needed.  Patient started on broad-spectrum coverage with vancomycin Rocephin and Flagyl.   >> Essential hypertension As needed hydralazine, continue patient's Coreg  and diltiazem .   >> AKI: Lab Results  Component Value Date   CREATININE 1.03 (H) 12/21/2023   CREATININE 0.93 10/30/2023   CREATININE 0.87 10/28/2023  Monitor, avoid contrast, encourage p.o. fluids.   >> Prediabetes: A1c.Discussed with daughter that I suspect she may have progressed to diabetes mellitus type 2 and that could be 1 reason why she is having an infection.  Also  informed her that if she does indeed have diabetes we will start her on sliding scale regimen and insulin protocol for now  >> History of CVA: Continue Eliquis .  Baseline pics in chart.   >> PAF status post ablation: Currently in A-fib    DVT prophylaxis:  Eliquis  Consults:  None  Advance Care Planning:    Code Status: Full Code   Family Communication:  Daughter Disposition Plan:  Home Severity of Illness: The appropriate patient status for this patient is OBSERVATION. Observation status is judged to be reasonable and necessary in order to provide the required intensity of service to ensure the patient's safety. The patient's presenting symptoms, physical exam findings, and initial radiographic and laboratory data in the context of their medical condition is felt to place them at decreased risk for further clinical deterioration. Furthermore, it is anticipated that the patient will be medically stable for discharge from the hospital within 2 midnights of admission.   Unresulted Labs (From admission, onward)     Start     Ordered   12/22/23 1218  Hemoglobin A1c  Once,   R        12/22/23 1217   12/21/23 2213  Lactic acid, plasma  (Undifferentiated presentation (screening labs and basic nursing orders))  STAT Now then every 2 hours,   R      12/21/23 2213   12/21/23 2213  Blood Culture (routine x 2)  (Undifferentiated presentation (screening labs and basic nursing orders))  BLOOD  CULTURE X 2,   STAT      12/21/23 2213            Meds ordered this encounter  Medications   acetaminophen  (TYLENOL ) tablet 650 mg   sodium chloride  0.9 % bolus 500 mL   fentaNYL (SUBLIMAZE) injection 50 mcg   ondansetron  (ZOFRAN ) injection 4 mg   cefTRIAXone (ROCEPHIN) 1 g in sodium chloride  0.9 % 100 mL IVPB    Antibiotic Indication::   Other Indication (list below)    Other Indication::   dental abscess   metroNIDAZOLE (FLAGYL) IVPB 500 mg    Antibiotic Indication::   Other Indication  (list below)    Other Indication::   dental abscess   iohexol  (OMNIPAQUE ) 300 MG/ML solution 75 mL   fentaNYL (SUBLIMAZE) injection 50 mcg   apixaban  (ELIQUIS ) tablet 5 mg   DISCONTD: carvedilol  (COREG ) tablet 12.5-25 mg   diltiazem  (CARDIZEM  CD) 24 hr capsule 120 mg   irbesartan  (AVAPRO ) tablet 150 mg   rosuvastatin  (CRESTOR ) tablet 40 mg   DISCONTD: vancomycin (VANCOCIN) IVPB 1000 mg/200 mL premix    Antibiotic Indication::   Cellulitis   ceFEPIme (MAXIPIME) 2 g in sodium chloride  0.9 % 100 mL IVPB    Antibiotic Indication::   Cellulitis   lactated ringers  infusion   OR Linked Order Group    acetaminophen  (TYLENOL ) tablet 650 mg    acetaminophen  (TYLENOL ) suppository 650 mg   polyethylene glycol (MIRALAX / GLYCOLAX) packet 17 g   bisacodyl  (DULCOLAX) EC tablet 5 mg   OR Linked Order Group    ondansetron  (ZOFRAN ) tablet 4 mg    ondansetron  (ZOFRAN ) injection 4 mg   hydrALAZINE (APRESOLINE) injection 5 mg   AND Linked Order Group    carvedilol  (COREG ) tablet 25 mg    carvedilol  (COREG ) tablet 3.125 mg   vancomycin (VANCOREADY) IVPB 1250 mg/250 mL    Indication::   Bacteremia   vancomycin (VANCOCIN) IVPB 1000 mg/200 mL premix    Indication::   Cellulitis   metroNIDAZOLE (FLAGYL) IVPB 500 mg    Antibiotic Indication::   Other Indication (list below)    Other Indication::   dental abscess     Orders Placed This Encounter  Procedures   Blood Culture (routine x 2)   CT Maxillofacial W Contrast   CBC with Differential   Comprehensive metabolic panel   Lactic acid, plasma   Protime-INR   Urinalysis, w/ Reflex to Culture (Infection Suspected) -Urine, Clean Catch   Hemoglobin A1c   Diet heart healthy/carb modified Room service appropriate? Yes with Assist; Fluid consistency: Nectar Thick   Document height and weight   Assess and Document Glasgow Coma Scale   Document vital signs within 1-hour of fluid bolus completion.  Notify provider of abnormal vital signs despite fluid  resuscitation.   Refer to Sidebar Report: Sepsis Bundle ED/IP   Notify provider for difficulties obtaining IV access   Initiate Carrier Fluid Protocol   Cardiac Monitoring - Continuous Indefinite   Swallow screen   Apply Cellulitis Care Plan   Patient has an active order for admit to inpatient/place in observation   Vital signs   Notify physician (specify)   Mobility Protocol: No Restrictions   Refer to Sidebar Report Mobility Protocol for Adult Inpatient   Initiate Adult Central Line Maintenance and Catheter Protocol for patients with central line (CVC, PICC, Port, Hemodialysis, Trialysis)   If patient diabetic or glucose greater than 140 notify physician for Sliding Scale Insulin Orders   Intake  and Output   Initiate CHG Protocol   Do not place and if present remove PureWick   Initiate Oral Care Protocol   Initiate Carrier Fluid Protocol   RN may order General Admission PRN Orders utilizing General Admission PRN medications (through manage orders) for the following patient needs: allergy symptoms (Claritin), cold sores (Carmex), cough (Robitussin DM), eye irritation (Liquifilm Tears), hemorrhoids (Tucks), indigestion (Maalox), minor skin irritation (Hydrocortisone Cream), muscle pain Lucienne Gay), nose irritation (saline nasal spray) and sore throat (Chloraseptic spray).   Full code   Consult to hospitalist   vancomycin per pharmacy consult   Pulse oximetry check with vital signs   Oxygen therapy Mode or (Route): Nasal cannula; Liters Per Minute: 2; Keep O2 saturation between: greater than 92 %   ED EKG   Insert peripheral IV X 1   Place in observation (patient's expected length of stay will be less than 2 midnights)   Aspiration precautions   Fall precautions    Author: Mario LULLA Blanch, MD 12 pm- 8 pm. [facility]. 12/22/2023 1:34 PM Please note for any communication after hours contact [facility] Assigned provider on call on Amion.

## 2023-12-22 NOTE — Progress Notes (Signed)
 New Admission Note:   Arrival Method: stretcher Mental Orientation: aa+ox4 Telemetry: n/a Assessment: Completed Skin: c/d/i IV: LR at 75 Pain: 7/10 jaw, refusing pain meds at present Tubes: n/a Safety Measures: Safety Fall Prevention Plan has been given, discussed and signed Admission: Completed [facility] Orientation: Patient has been orientated to the room, unit and staff.  Family: present  Orders have been reviewed and implemented. Will continue to monitor the patient. Call light has been placed within reach and bed alarm has been activated. Bedside swallow eval completed, pt. Able to swallow and chew without difficulty, MD aware  Doyal Sias, RN

## 2023-12-23 DIAGNOSIS — L03211 Cellulitis of face: Secondary | ICD-10-CM | POA: Diagnosis not present

## 2023-12-23 MED ORDER — CARVEDILOL 12.5 MG PO TABS
12.5000 mg | ORAL_TABLET | Freq: Every day | ORAL | Status: DC
  Administered 2023-12-24: 12.5 mg via ORAL
  Filled 2023-12-23 (×2): qty 1

## 2023-12-23 MED ORDER — AMPICILLIN-SULBACTAM SODIUM 3 (2-1) G IJ SOLR
3.0000 g | Freq: Three times a day (TID) | INTRAMUSCULAR | Status: DC
  Administered 2023-12-23 – 2023-12-24 (×2): 3 g via INTRAVENOUS
  Filled 2023-12-23 (×2): qty 8

## 2023-12-23 MED ORDER — ZOLPIDEM TARTRATE 5 MG PO TABS
5.0000 mg | ORAL_TABLET | Freq: Every evening | ORAL | Status: DC | PRN
  Administered 2023-12-23: 5 mg via ORAL
  Filled 2023-12-23 (×2): qty 1

## 2023-12-23 MED ORDER — EPINEPHRINE 0.3 MG/0.3ML IJ SOAJ
0.3000 mg | Freq: Once | INTRAMUSCULAR | Status: DC | PRN
  Filled 2023-12-23: qty 0.3

## 2023-12-23 MED ORDER — FLECAINIDE ACETATE 50 MG PO TABS
50.0000 mg | ORAL_TABLET | Freq: Two times a day (BID) | ORAL | Status: DC
  Administered 2023-12-23 – 2023-12-24 (×3): 50 mg via ORAL
  Filled 2023-12-23 (×4): qty 1

## 2023-12-23 MED ORDER — CARVEDILOL 3.125 MG PO TABS
3.1250 mg | ORAL_TABLET | Freq: Every evening | ORAL | Status: DC
  Administered 2023-12-23: 3.125 mg via ORAL
  Filled 2023-12-23: qty 1

## 2023-12-23 MED ORDER — DIPHENHYDRAMINE HCL 50 MG/ML IJ SOLN
25.0000 mg | Freq: Once | INTRAMUSCULAR | Status: DC | PRN
  Filled 2023-12-23: qty 1

## 2023-12-23 MED ORDER — AMOXICILLIN 500 MG PO CAPS
500.0000 mg | ORAL_CAPSULE | Freq: Once | ORAL | Status: AC
  Administered 2023-12-23: 500 mg via ORAL
  Filled 2023-12-23: qty 1

## 2023-12-23 NOTE — Progress Notes (Signed)
 [facility] Progress Note Patient: Stephanie Rodgers FMW:[id number] DOB: Aug 26, 1940  DOA: 12/21/2023 DOS: the patient was seen and examined on 12/23/2023  Brief Hospital Course: Patient with PMH of obesity, A-fib status post ablation, CVA, HTN presented to the hospital with complaints of facial swelling after dental extraction. Underwent procedure on Thursday. Was on clindamycin. Dose was increased by dentist as there was more swelling. As pain and swelling was progressively worsening decided to come to the hospital. Patient was started on broad-spectrum antibiotic.  Cultures were performed. CT maxillofacial negative for any drainable abscess.  Assessment and Plan: Left facial cellulitis. After dental extraction. Was on clindamycin. Then on Vanco meropenem and Flagyl. Currently on Unasyn. Will monitor.  Penicillin allergy. Ruled out. Reportedly had penicillin allergy when she was in her teens. Currently was agreeable to attack amoxicillin challenge test which she successfully passed. For now we will switch to Augmentin and monitor.  HTN. Continue home regimen.  Prediabetes. Hemoglobin A1c 6.0. Monitor. Does not want to take medication at home.  History of CVA. Paroxysmal A-fib status post ablation. Rate currently controlled.  Normal axis request.  Continue.  Mild renal insufficiency. No evidence of AKI. Baseline creatinine 0.9.  Currently admission creatinine 1.23. Received IV hydration.   Obesity Class 1 Body mass index is 30.9 kg/m.  Placing the pt at higher risk of poor outcomes.   Subjective: No nausea no vomiting no fever no chills.  Pain improving.  Swelling improving.  No redness.  Physical Exam: Clear to auscultation. S1-S2 present No stridor. Significant improvement in facial swelling. No redness erythema warmth or tenderness on exam.  Data Reviewed: I have Reviewed nursing notes, Vitals, and Lab results. Since last encounter, pertinent lab results  CBC BMP   . I have ordered test including CBC and BMP  .   Disposition: Status is: Observation The patient remains OBS appropriate and will d/c before 2 midnights.  apixaban  (ELIQUIS ) tablet 5 mg    Family Communication: No one at bedside Level of care: Telemetry   Vitals:   12/23/23 1518 12/23/23 1534 12/23/23 1556 12/23/23 1620  BP: (!) 142/52 (!) 132/56 (!) 136/51 (!) 119/56  Pulse: 81 (!) 58 (!) 58 61  Resp:   18   Temp: 98.3 F (36.8 C) 98.1 F (36.7 C) 98.5 F (36.9 C) 98.5 F (36.9 C)  TempSrc: Oral Oral Oral Oral  SpO2: 98% 98% 98% 98%  Weight:      Height:         Author: Yetta Blanch, MD 12/23/2023 7:16 PM  Please look on [email or web address] to find out who is on call.

## 2023-12-23 NOTE — Progress Notes (Signed)
 Amoxicillin administered per MD order, VSS x 1 hour, no adverse reactions noted at this time, will cont. To monitor  Stephanie Rodgers

## 2023-12-23 NOTE — Plan of Care (Signed)
 ?  Problem: Clinical Measurements: ?Goal: Ability to avoid or minimize complications of infection will improve ?Outcome: Progressing ?  ?Problem: Skin Integrity: ?Goal: Skin integrity will improve ?Outcome: Progressing ?  ?

## 2023-12-23 NOTE — Plan of Care (Signed)
   Problem: Education: Goal: Knowledge of General Education information will improve Description Including pain rating scale, medication(s)/side effects and non-pharmacologic comfort measures Outcome: Progressing

## 2023-12-23 NOTE — Hospital Course (Signed)
 Patient with PMH of obesity, A-fib status post ablation, CVA, HTN presented to the hospital with complaints of facial swelling after dental extraction. Underwent procedure on Thursday. Was on clindamycin. Dose was increased by dentist as there was more swelling. As pain and swelling was progressively worsening decided to come to the hospital. Patient was started on broad-spectrum antibiotic.  Cultures were performed. CT maxillofacial negative for any drainable abscess.  Assessment and Plan: Left facial cellulitis. After dental extraction. Was on clindamycin. Then on Vanco meropenem and Flagyl. Currently on Unasyn. Will monitor.  Penicillin allergy. Ruled out. Reportedly had penicillin allergy when she was in her teens. Currently was agreeable to attack amoxicillin challenge test which she successfully passed. For now we will switch to Augmentin and monitor.  HTN. Continue home regimen.  Prediabetes. Hemoglobin A1c 6.0. Monitor. Does not want to take medication at home.  History of CVA. Paroxysmal A-fib status post ablation. Rate currently controlled.  Normal axis request.  Continue.  Mild renal insufficiency. No evidence of AKI. Baseline creatinine 0.9.  Currently admission creatinine 1.23. Received IV hydration.

## 2023-12-24 ENCOUNTER — Other Ambulatory Visit (HOSPITAL_COMMUNITY): Payer: Self-pay

## 2023-12-24 DIAGNOSIS — L03211 Cellulitis of face: Secondary | ICD-10-CM | POA: Diagnosis not present

## 2023-12-24 LAB — CBC
HCT: 36.4 % (ref 36.0–46.0)
Hemoglobin: 11.9 g/dL — ABNORMAL LOW (ref 12.0–15.0)
MCH: 30.3 pg (ref 26.0–34.0)
MCHC: 32.7 g/dL (ref 30.0–36.0)
MCV: 92.6 fL (ref 80.0–100.0)
Platelets: 163 K/uL (ref 150–400)
RBC: 3.93 MIL/uL (ref 3.87–5.11)
RDW: 14.7 % (ref 11.5–15.5)
WBC: 4.9 K/uL (ref 4.0–10.5)
nRBC: 0 % (ref 0.0–0.2)

## 2023-12-24 LAB — BASIC METABOLIC PANEL WITH GFR
Anion gap: 12 (ref 5–15)
BUN: 13 mg/dL (ref 8–23)
CO2: 22 mmol/L (ref 22–32)
Calcium: 8.8 mg/dL — ABNORMAL LOW (ref 8.9–10.3)
Chloride: 104 mmol/L (ref 98–111)
Creatinine, Ser: 0.64 mg/dL (ref 0.44–1.00)
GFR, Estimated: >60 mL/min (ref >60)
Glucose, Bld: 149 mg/dL — ABNORMAL HIGH (ref 70–99)
Potassium: 3.8 mmol/L (ref 3.5–5.1)
Sodium: 138 mmol/L (ref 135–145)

## 2023-12-24 LAB — MAGNESIUM: Magnesium: 1.9 mg/dL (ref 1.7–2.4)

## 2023-12-24 MED ORDER — SACCHAROMYCES BOULARDII 250 MG PO CAPS
250.0000 mg | ORAL_CAPSULE | Freq: Two times a day (BID) | ORAL | Status: DC
  Administered 2023-12-24: 250 mg via ORAL
  Filled 2023-12-24: qty 1

## 2023-12-24 MED ORDER — AMOXICILLIN-POT CLAVULANATE 875-125 MG PO TABS
1.0000 | ORAL_TABLET | Freq: Two times a day (BID) | ORAL | 0 refills | Status: AC
  Filled 2023-12-24: qty 6, 3d supply, fill #0

## 2023-12-24 MED ORDER — LOPERAMIDE HCL 2 MG PO CAPS
2.0000 mg | ORAL_CAPSULE | Freq: Once | ORAL | Status: AC | PRN
  Administered 2023-12-24: 2 mg via ORAL
  Filled 2023-12-24: qty 1

## 2023-12-24 MED ORDER — LOPERAMIDE HCL 2 MG PO CAPS: 2.0000 mg | ORAL_CAPSULE | ORAL | 0 refills | Status: DC | PRN

## 2023-12-24 MED ORDER — LOPERAMIDE HCL 2 MG PO CAPS: 2.0000 mg | ORAL_CAPSULE | ORAL | Status: DC | PRN

## 2023-12-24 MED ORDER — AMOXICILLIN-POT CLAVULANATE 875-125 MG PO TABS
1.0000 | ORAL_TABLET | Freq: Two times a day (BID) | ORAL | Status: DC
  Administered 2023-12-24: 1 via ORAL
  Filled 2023-12-24: qty 1

## 2023-12-24 MED ORDER — LOPERAMIDE HCL 2 MG PO CAPS
2.0000 mg | ORAL_CAPSULE | ORAL | 0 refills | Status: AC | PRN
  Filled 2023-12-24: qty 10, 10d supply, fill #0

## 2023-12-24 MED ORDER — AMOXICILLIN-POT CLAVULANATE 875-125 MG PO TABS: 1.0000 | ORAL_TABLET | Freq: Two times a day (BID) | ORAL | 0 refills | Status: DC

## 2023-12-24 NOTE — Care Management (Signed)
  Transition of Care [facility]) Screening Note   Patient Details  Name: Stephanie Rodgers Date of Birth: 1940-03-24   Transition of Care [facility]) CM/SW Contact:    Corean JAYSON Canary, RN Phone Number: 12/24/2023, 8:32 AM    Transition of Care Department [facility]) has reviewed patient and no TOC needs have been identified at this time. We will continue to monitor patient advancement through interdisciplinary progression rounds. If new patient transition needs arise, please place a TOC consult.

## 2023-12-24 NOTE — Progress Notes (Signed)
 DISCHARGE NOTE HOME Stephanie Rodgers to be discharged Home per MD order. Discussed prescriptions and follow up appointments with the patient. Prescriptions given to patient; medication list explained in detail. Patient verbalized understanding.  Skin clean, dry and intact without evidence of skin break down, no evidence of skin tears noted. IV catheter discontinued intact. Site without signs and symptoms of complications. Dressing and pressure applied. Pt denies pain at the site currently. No complaints noted.  Patient free of lines, drains, and wounds.   An After Visit Summary (AVS) was printed and given to the patient. Patient escorted via wheelchair, and discharged home via private auto.  Doyal Sias, RN

## 2023-12-26 NOTE — Discharge Summary (Signed)
 Physician Discharge Summary   Patient: Stephanie Rodgers MRN: [id number] DOB: 06/04/1940  Admit date:     12/21/2023  Discharge date: 12/24/2023  Discharge Physician: Yetta Blanch  PCP: Norleen Nohemi Shuck, MD  Recommendations at discharge: Follow-up with PCP in 1 week.   Follow-up Information     Norleen Nohemi Shuck, MD. Schedule an appointment as soon as possible for a visit in 1 week(s).   Specialty: Internal Medicine Contact information: [address] [place] [address] [phone]                Hospital Course: Patient with PMH of obesity, A-fib status post ablation, CVA, HTN presented to the hospital with complaints of facial swelling after dental extraction. Underwent procedure on Thursday. Was on clindamycin. Dose was increased by dentist as there was more swelling. As pain and swelling was progressively worsening decided to come to the hospital. Patient was started on broad-spectrum antibiotic.  Cultures were performed. CT maxillofacial negative for any drainable abscess.  Assessment and Plan: Left facial cellulitis. After dental extraction. Was on clindamycin. Then on Vanco meropenem and Flagyl. Currently on Unasyn.  Will switch to oral Augmentin.  Penicillin allergy. Ruled out. Reportedly had penicillin allergy when she was in her teens. Currently was agreeable to attack amoxicillin challenge test which she successfully passed. For now we will switch to Augmentin and monitor.  HTN. Continue home regimen.  Prediabetes. Hemoglobin A1c 6.0. Monitor. Does not want to take medication at home.  History of CVA. Paroxysmal A-fib status post ablation. Rate currently controlled.  Normal axis request.  Continue.  Mild renal insufficiency. No evidence of AKI. Baseline creatinine 0.9.  Currently admission creatinine 1.23. Received IV hydration.  Consultants:  none  Procedures performed:  none  DISCHARGE MEDICATION: Allergies as of  12/24/2023       Reactions   Alendronate Other (See Comments)   Edema, joint pain   Ciprofloxacin Other (See Comments)   Abdominal pain, fatigue   Hydrochlorothiazide Other (See Comments)   gout   Penicillins Rash, Other (See Comments)   Welts 12/23/23: tolerated Amoxicillin PO challenge > Unasyn > Augmentin   Amiodarone Other (See Comments)   Dizziness, numbness, fatigue.    Colesevelam Other (See Comments)   unknown   Cefepime Itching   Hot flashes and facial flushing        Medication List     TAKE these medications    acetaminophen  500 MG tablet Commonly known as: TYLENOL  Take 1,000 mg by mouth every 6 (six) hours as needed for mild pain, fever or headache.   amoxicillin-clavulanate 875-125 MG tablet Commonly known as: AUGMENTIN Take 1 tablet by mouth 2 (two) times daily for 3 days.   apixaban  5 MG Tabs tablet Commonly known as: ELIQUIS  Take 1 tablet (5 mg total) by mouth 2 (two) times daily.   calcium  carbonate 500 MG chewable tablet Commonly known as: TUMS - dosed in mg elemental calcium  Chew 2 tablets by mouth as needed for indigestion or heartburn.   carvedilol  12.5 MG tablet Commonly known as: COREG  Take 12.5-25 mg by mouth See admin instructions. 2qam 1qpm   diltiazem  120 MG 24 hr capsule Commonly known as: CARDIZEM  CD Take 120 mg by mouth in the morning.   flecainide 50 MG tablet Commonly known as: TAMBOCOR Take 50 mg by mouth 2 (two) times daily.   furosemide 20 MG tablet Commonly known as: LASIX Take 20 mg by mouth in the morning.   irbesartan   150 MG tablet Commonly known as: AVAPRO  Take 150 mg by mouth daily as needed (HBP).   loperamide 2 MG capsule Commonly known as: IMODIUM Take 1 capsule (2 mg total) by mouth as needed for diarrhea or loose stools (use only if have more than 2 lose BM in a day, do no use if have abdominal pain with nausea and vomiting and call doctor).   loratadine-pseudoephedrine 10-240 MG 24 hr tablet Commonly  known as: CLARITIN-D 24-hour Take 1 tablet by mouth in the morning.   PROBIOTIC PO Take 1 tablet by mouth as needed (when taking antibiotics).   rosuvastatin  40 MG tablet Commonly known as: CRESTOR  Take 40 mg by mouth at bedtime.   sodium chloride  2 % ophthalmic solution Commonly known as: MURO 128 Place 1 drop into both eyes at bedtime.   tiZANidine 2 MG tablet Commonly known as: ZANAFLEX Take 2 mg by mouth 2 (two) times daily as needed for muscle spasms.   VITAMIN D3 PO Take 1 tablet by mouth in the morning.   zolpidem  10 MG tablet Commonly known as: AMBIEN  Take 10 mg by mouth at bedtime.       Disposition: Home Diet recommendation: Cardiac diet  Discharge Exam: Vitals:   12/23/23 1620 12/23/23 2015 12/24/23 0453 12/24/23 0758  BP: (!) 119/56 (!) 157/56 (!) 164/77 (!) 190/74  Pulse: 61 66 81 61  Resp:  18 18 18   Temp: 98.5 F (36.9 C) 98.3 F (36.8 C) 98.2 F (36.8 C) 98.1 F (36.7 C)  TempSrc: Oral Oral Oral Oral  SpO2: 98% 90% 95% 97%  Weight:      Height:       Clear to auscultation for S1-S2 present Bowel sounds present.  Nontender. Chronic edema of lower extremities. Filed Weights   12/21/23 1956 12/22/23 1158  Weight: 86.2 kg 81.6 kg   Condition at discharge: stable  The results of significant diagnostics from this hospitalization (including imaging, microbiology, ancillary and laboratory) are listed below for reference.   Imaging Studies: CT Maxillofacial W Contrast Result Date: 12/21/2023 EXAM: CT Face with contrast 12/21/2023 11:21:07 PM TECHNIQUE: CT of the face was performed with the administration of 75 mL of iohexol  (OMNIPAQUE ) 300 MG/ML solution. Multiplanar reformatted images are provided for review. Automated exposure control, iterative reconstruction, and/or weight based adjustment of the mA/kV was utilized to reduce the radiation dose to as low as reasonably achievable. COMPARISON: None available CLINICAL HISTORY: Swelling to left  mandible, assess for underlying infection. 83 y/o female had left lower tooth extracted 10/01. Has been on antibiotics since. Increased pain/swelling today. FINDINGS: AERODIGESTIVE TRACT: No mass. No edema. SALIVARY GLANDS: No acute abnormality. LYMPH NODES: No suspicious cervical lymphadenopathy. SOFT TISSUES: No mass or fluid collection. There is soft tissue swelling at the left mandibular body where there is also the empty socket of tooth 21. There is no abscess or drainable fluid collection. BRAIN, ORBITS AND SINUSES: No acute abnormality. Ocular lens replacements. Leftward deviation of the nasal septum with leftward projecting osseous spur without turbinate contact. BONES: No acute abnormality. No suspicious bone lesion. Empty socket of tooth 21. IMPRESSION: 1. Soft tissue swelling at the left mandibular body at the site of the empty socket of tooth 21, without abscess or drainable fluid collection. Electronically signed by: Franky Stanford MD 12/21/2023 11:27 PM EDT RP Workstation: [id number]    Microbiology: Results for orders placed or performed during the hospital encounter of 12/21/23  Blood Culture (routine x 2)     Status:  None (Preliminary result)   Collection Time: 12/21/23 10:51 PM   Specimen: BLOOD  Result Value Ref Range Status   Specimen Description   Final    BLOOD LEFT ANTECUBITAL Performed at [facility], [address] [place], [place] [address]    Special Requests   Final    BOTTLES DRAWN AEROBIC AND ANAEROBIC Blood Culture adequate volume Performed at [facility], [address] [place], [place] [address]    Culture   Final    NO GROWTH 4 DAYS Performed at [facility], [address], [place], [place] [address]    Report Status PENDING  Incomplete  Blood Culture (routine x 2)     Status: None (Preliminary result)   Collection Time: 12/21/23 10:51 PM   Specimen: BLOOD RIGHT FOREARM  Result Value Ref Range Status   Specimen Description    Final    BLOOD RIGHT FOREARM Performed at [facility], [address] [place], [place] [address]    Special Requests   Final    BOTTLES DRAWN AEROBIC AND ANAEROBIC Blood Culture results may not be optimal due to an inadequate volume of blood received in culture bottles Performed at [facility], [address] [place], [place] [address]    Culture   Final    NO GROWTH 4 DAYS Performed at [facility], [address], [place], [place] [address]    Report Status PENDING  Incomplete   Labs: CBC: Recent Labs  Lab 12/21/23 2002 12/24/23 1048  WBC 5.0 4.9  NEUTROABS 4.1  --   HGB 12.8 11.9*  HCT 38.5 36.4  MCV 91.9 92.6  PLT 186 163   Basic Metabolic Panel: Recent Labs  Lab 12/21/23 2002 12/24/23 1048  NA 137 138  K 4.3 3.8  CL 101 104  CO2 22 22  GLUCOSE 145* 149*  BUN 24* 13  CREATININE 1.03* 0.64  CALCIUM  9.2 8.8*  MG  --  1.9   Liver Function Tests: Recent Labs  Lab 12/21/23 2002  AST 54*  ALT 24  ALKPHOS 111  BILITOT 0.4  PROT 7.2  ALBUMIN 3.9   CBG: No results for input(s): GLUCAP in the last 168 hours.  Discharge time spent: greater than 30 minutes.  Author: Yetta Blanch, MD  [facility] 12/24/2023

## 2023-12-27 LAB — CULTURE, BLOOD (ROUTINE X 2)
Culture: NO GROWTH
Culture: NO GROWTH
Special Requests: ADEQUATE

## 2024-05-21 ENCOUNTER — Ambulatory Visit: Admitting: Urology
